# Patient Record
Sex: Male | Born: 1935 | Race: Black or African American | Hispanic: No | Marital: Married | Smoking: Former smoker
Health system: Southern US, Community
[De-identification: ages and names within clinical notes are randomized; demographics above are authoritative.]

## PROBLEM LIST (undated history)

## (undated) DIAGNOSIS — M1990 Unspecified osteoarthritis, unspecified site: Secondary | ICD-10-CM

## (undated) DIAGNOSIS — G20 Parkinson's disease: Secondary | ICD-10-CM

## (undated) DIAGNOSIS — I10 Essential (primary) hypertension: Secondary | ICD-10-CM

## (undated) DIAGNOSIS — G20A1 Parkinson's disease without dyskinesia, without mention of fluctuations: Secondary | ICD-10-CM

## (undated) DIAGNOSIS — E7800 Pure hypercholesterolemia, unspecified: Secondary | ICD-10-CM

## (undated) DIAGNOSIS — N400 Enlarged prostate without lower urinary tract symptoms: Secondary | ICD-10-CM

## (undated) DIAGNOSIS — F0390 Unspecified dementia without behavioral disturbance: Secondary | ICD-10-CM

## (undated) HISTORY — DX: Unspecified osteoarthritis, unspecified site: M19.90

## (undated) HISTORY — DX: Parkinson's disease: G20

## (undated) HISTORY — DX: Pure hypercholesterolemia, unspecified: E78.00

## (undated) HISTORY — DX: Essential (primary) hypertension: I10

## (undated) HISTORY — DX: Benign prostatic hyperplasia without lower urinary tract symptoms: N40.0

## (undated) HISTORY — PX: OTHER SURGICAL HISTORY: SHX169

## (undated) HISTORY — DX: Parkinson's disease without dyskinesia, without mention of fluctuations: G20.A1

---

## 2005-05-06 ENCOUNTER — Emergency Department (HOSPITAL_COMMUNITY): Admission: EM | Admit: 2005-05-06 | Discharge: 2005-05-06 | Payer: Self-pay | Admitting: Emergency Medicine

## 2011-08-05 DIAGNOSIS — M159 Polyosteoarthritis, unspecified: Secondary | ICD-10-CM | POA: Diagnosis not present

## 2011-08-15 DIAGNOSIS — M6281 Muscle weakness (generalized): Secondary | ICD-10-CM | POA: Diagnosis not present

## 2011-08-15 DIAGNOSIS — Z9181 History of falling: Secondary | ICD-10-CM | POA: Diagnosis not present

## 2011-08-15 DIAGNOSIS — M1990 Unspecified osteoarthritis, unspecified site: Secondary | ICD-10-CM | POA: Diagnosis not present

## 2011-08-15 DIAGNOSIS — IMO0001 Reserved for inherently not codable concepts without codable children: Secondary | ICD-10-CM | POA: Diagnosis not present

## 2011-08-15 DIAGNOSIS — I10 Essential (primary) hypertension: Secondary | ICD-10-CM | POA: Diagnosis not present

## 2011-08-15 DIAGNOSIS — G20 Parkinson's disease: Secondary | ICD-10-CM | POA: Diagnosis not present

## 2011-08-19 DIAGNOSIS — IMO0001 Reserved for inherently not codable concepts without codable children: Secondary | ICD-10-CM | POA: Diagnosis not present

## 2011-08-19 DIAGNOSIS — I10 Essential (primary) hypertension: Secondary | ICD-10-CM | POA: Diagnosis not present

## 2011-08-19 DIAGNOSIS — M1990 Unspecified osteoarthritis, unspecified site: Secondary | ICD-10-CM | POA: Diagnosis not present

## 2011-08-19 DIAGNOSIS — Z9181 History of falling: Secondary | ICD-10-CM | POA: Diagnosis not present

## 2011-08-19 DIAGNOSIS — G20 Parkinson's disease: Secondary | ICD-10-CM | POA: Diagnosis not present

## 2011-08-19 DIAGNOSIS — M6281 Muscle weakness (generalized): Secondary | ICD-10-CM | POA: Diagnosis not present

## 2011-08-21 DIAGNOSIS — I10 Essential (primary) hypertension: Secondary | ICD-10-CM | POA: Diagnosis not present

## 2011-08-21 DIAGNOSIS — M6281 Muscle weakness (generalized): Secondary | ICD-10-CM | POA: Diagnosis not present

## 2011-08-21 DIAGNOSIS — IMO0001 Reserved for inherently not codable concepts without codable children: Secondary | ICD-10-CM | POA: Diagnosis not present

## 2011-08-21 DIAGNOSIS — M1990 Unspecified osteoarthritis, unspecified site: Secondary | ICD-10-CM | POA: Diagnosis not present

## 2011-08-21 DIAGNOSIS — Z9181 History of falling: Secondary | ICD-10-CM | POA: Diagnosis not present

## 2011-08-21 DIAGNOSIS — G20 Parkinson's disease: Secondary | ICD-10-CM | POA: Diagnosis not present

## 2011-08-26 DIAGNOSIS — IMO0001 Reserved for inherently not codable concepts without codable children: Secondary | ICD-10-CM | POA: Diagnosis not present

## 2011-08-26 DIAGNOSIS — M1990 Unspecified osteoarthritis, unspecified site: Secondary | ICD-10-CM | POA: Diagnosis not present

## 2011-08-26 DIAGNOSIS — M6281 Muscle weakness (generalized): Secondary | ICD-10-CM | POA: Diagnosis not present

## 2011-08-26 DIAGNOSIS — Z9181 History of falling: Secondary | ICD-10-CM | POA: Diagnosis not present

## 2011-08-26 DIAGNOSIS — I10 Essential (primary) hypertension: Secondary | ICD-10-CM | POA: Diagnosis not present

## 2011-08-26 DIAGNOSIS — G20 Parkinson's disease: Secondary | ICD-10-CM | POA: Diagnosis not present

## 2011-08-28 DIAGNOSIS — IMO0001 Reserved for inherently not codable concepts without codable children: Secondary | ICD-10-CM | POA: Diagnosis not present

## 2011-08-28 DIAGNOSIS — M1990 Unspecified osteoarthritis, unspecified site: Secondary | ICD-10-CM | POA: Diagnosis not present

## 2011-08-28 DIAGNOSIS — M6281 Muscle weakness (generalized): Secondary | ICD-10-CM | POA: Diagnosis not present

## 2011-08-28 DIAGNOSIS — G20 Parkinson's disease: Secondary | ICD-10-CM | POA: Diagnosis not present

## 2011-08-28 DIAGNOSIS — Z9181 History of falling: Secondary | ICD-10-CM | POA: Diagnosis not present

## 2011-08-28 DIAGNOSIS — I10 Essential (primary) hypertension: Secondary | ICD-10-CM | POA: Diagnosis not present

## 2011-08-30 DIAGNOSIS — G20 Parkinson's disease: Secondary | ICD-10-CM | POA: Diagnosis not present

## 2011-08-30 DIAGNOSIS — M4800 Spinal stenosis, site unspecified: Secondary | ICD-10-CM | POA: Diagnosis not present

## 2011-08-30 DIAGNOSIS — R269 Unspecified abnormalities of gait and mobility: Secondary | ICD-10-CM | POA: Diagnosis not present

## 2011-08-30 DIAGNOSIS — R413 Other amnesia: Secondary | ICD-10-CM | POA: Diagnosis not present

## 2011-09-02 DIAGNOSIS — G20 Parkinson's disease: Secondary | ICD-10-CM | POA: Diagnosis not present

## 2011-09-02 DIAGNOSIS — Z9181 History of falling: Secondary | ICD-10-CM | POA: Diagnosis not present

## 2011-09-02 DIAGNOSIS — I10 Essential (primary) hypertension: Secondary | ICD-10-CM | POA: Diagnosis not present

## 2011-09-02 DIAGNOSIS — M1990 Unspecified osteoarthritis, unspecified site: Secondary | ICD-10-CM | POA: Diagnosis not present

## 2011-09-02 DIAGNOSIS — M6281 Muscle weakness (generalized): Secondary | ICD-10-CM | POA: Diagnosis not present

## 2011-09-02 DIAGNOSIS — IMO0001 Reserved for inherently not codable concepts without codable children: Secondary | ICD-10-CM | POA: Diagnosis not present

## 2011-09-04 DIAGNOSIS — M6281 Muscle weakness (generalized): Secondary | ICD-10-CM | POA: Diagnosis not present

## 2011-09-04 DIAGNOSIS — I10 Essential (primary) hypertension: Secondary | ICD-10-CM | POA: Diagnosis not present

## 2011-09-04 DIAGNOSIS — G20 Parkinson's disease: Secondary | ICD-10-CM | POA: Diagnosis not present

## 2011-09-04 DIAGNOSIS — IMO0001 Reserved for inherently not codable concepts without codable children: Secondary | ICD-10-CM | POA: Diagnosis not present

## 2011-09-04 DIAGNOSIS — M1990 Unspecified osteoarthritis, unspecified site: Secondary | ICD-10-CM | POA: Diagnosis not present

## 2011-09-04 DIAGNOSIS — Z9181 History of falling: Secondary | ICD-10-CM | POA: Diagnosis not present

## 2011-09-09 DIAGNOSIS — G20 Parkinson's disease: Secondary | ICD-10-CM | POA: Diagnosis not present

## 2011-09-09 DIAGNOSIS — M1990 Unspecified osteoarthritis, unspecified site: Secondary | ICD-10-CM | POA: Diagnosis not present

## 2011-09-09 DIAGNOSIS — IMO0001 Reserved for inherently not codable concepts without codable children: Secondary | ICD-10-CM | POA: Diagnosis not present

## 2011-09-09 DIAGNOSIS — Z9181 History of falling: Secondary | ICD-10-CM | POA: Diagnosis not present

## 2011-09-09 DIAGNOSIS — M6281 Muscle weakness (generalized): Secondary | ICD-10-CM | POA: Diagnosis not present

## 2011-09-09 DIAGNOSIS — I10 Essential (primary) hypertension: Secondary | ICD-10-CM | POA: Diagnosis not present

## 2011-09-11 DIAGNOSIS — M6281 Muscle weakness (generalized): Secondary | ICD-10-CM | POA: Diagnosis not present

## 2011-09-11 DIAGNOSIS — IMO0001 Reserved for inherently not codable concepts without codable children: Secondary | ICD-10-CM | POA: Diagnosis not present

## 2011-09-11 DIAGNOSIS — I10 Essential (primary) hypertension: Secondary | ICD-10-CM | POA: Diagnosis not present

## 2011-09-11 DIAGNOSIS — Z9181 History of falling: Secondary | ICD-10-CM | POA: Diagnosis not present

## 2011-09-11 DIAGNOSIS — M1990 Unspecified osteoarthritis, unspecified site: Secondary | ICD-10-CM | POA: Diagnosis not present

## 2011-09-11 DIAGNOSIS — G20 Parkinson's disease: Secondary | ICD-10-CM | POA: Diagnosis not present

## 2011-09-16 DIAGNOSIS — I10 Essential (primary) hypertension: Secondary | ICD-10-CM | POA: Diagnosis not present

## 2011-09-16 DIAGNOSIS — I2510 Atherosclerotic heart disease of native coronary artery without angina pectoris: Secondary | ICD-10-CM | POA: Diagnosis not present

## 2011-09-16 DIAGNOSIS — N400 Enlarged prostate without lower urinary tract symptoms: Secondary | ICD-10-CM | POA: Diagnosis not present

## 2011-09-16 DIAGNOSIS — IMO0001 Reserved for inherently not codable concepts without codable children: Secondary | ICD-10-CM | POA: Diagnosis not present

## 2011-09-16 DIAGNOSIS — I252 Old myocardial infarction: Secondary | ICD-10-CM | POA: Diagnosis not present

## 2011-09-16 DIAGNOSIS — Z9181 History of falling: Secondary | ICD-10-CM | POA: Diagnosis not present

## 2011-09-16 DIAGNOSIS — M1990 Unspecified osteoarthritis, unspecified site: Secondary | ICD-10-CM | POA: Diagnosis not present

## 2011-09-16 DIAGNOSIS — M6281 Muscle weakness (generalized): Secondary | ICD-10-CM | POA: Diagnosis not present

## 2011-09-16 DIAGNOSIS — G20 Parkinson's disease: Secondary | ICD-10-CM | POA: Diagnosis not present

## 2011-09-18 DIAGNOSIS — M6281 Muscle weakness (generalized): Secondary | ICD-10-CM | POA: Diagnosis not present

## 2011-09-18 DIAGNOSIS — IMO0001 Reserved for inherently not codable concepts without codable children: Secondary | ICD-10-CM | POA: Diagnosis not present

## 2011-09-18 DIAGNOSIS — I10 Essential (primary) hypertension: Secondary | ICD-10-CM | POA: Diagnosis not present

## 2011-09-18 DIAGNOSIS — G20 Parkinson's disease: Secondary | ICD-10-CM | POA: Diagnosis not present

## 2011-09-18 DIAGNOSIS — Z9181 History of falling: Secondary | ICD-10-CM | POA: Diagnosis not present

## 2011-09-18 DIAGNOSIS — M1990 Unspecified osteoarthritis, unspecified site: Secondary | ICD-10-CM | POA: Diagnosis not present

## 2011-09-23 DIAGNOSIS — IMO0001 Reserved for inherently not codable concepts without codable children: Secondary | ICD-10-CM | POA: Diagnosis not present

## 2011-09-23 DIAGNOSIS — M1990 Unspecified osteoarthritis, unspecified site: Secondary | ICD-10-CM | POA: Diagnosis not present

## 2011-09-23 DIAGNOSIS — G20 Parkinson's disease: Secondary | ICD-10-CM | POA: Diagnosis not present

## 2011-09-23 DIAGNOSIS — I10 Essential (primary) hypertension: Secondary | ICD-10-CM | POA: Diagnosis not present

## 2011-09-23 DIAGNOSIS — M6281 Muscle weakness (generalized): Secondary | ICD-10-CM | POA: Diagnosis not present

## 2011-09-23 DIAGNOSIS — Z9181 History of falling: Secondary | ICD-10-CM | POA: Diagnosis not present

## 2011-09-25 DIAGNOSIS — IMO0001 Reserved for inherently not codable concepts without codable children: Secondary | ICD-10-CM | POA: Diagnosis not present

## 2011-09-25 DIAGNOSIS — G20 Parkinson's disease: Secondary | ICD-10-CM | POA: Diagnosis not present

## 2011-09-25 DIAGNOSIS — I10 Essential (primary) hypertension: Secondary | ICD-10-CM | POA: Diagnosis not present

## 2011-09-25 DIAGNOSIS — M6281 Muscle weakness (generalized): Secondary | ICD-10-CM | POA: Diagnosis not present

## 2011-09-25 DIAGNOSIS — Z9181 History of falling: Secondary | ICD-10-CM | POA: Diagnosis not present

## 2011-09-25 DIAGNOSIS — M1990 Unspecified osteoarthritis, unspecified site: Secondary | ICD-10-CM | POA: Diagnosis not present

## 2011-09-30 DIAGNOSIS — M1990 Unspecified osteoarthritis, unspecified site: Secondary | ICD-10-CM | POA: Diagnosis not present

## 2011-09-30 DIAGNOSIS — G20 Parkinson's disease: Secondary | ICD-10-CM | POA: Diagnosis not present

## 2011-09-30 DIAGNOSIS — I10 Essential (primary) hypertension: Secondary | ICD-10-CM | POA: Diagnosis not present

## 2011-09-30 DIAGNOSIS — IMO0001 Reserved for inherently not codable concepts without codable children: Secondary | ICD-10-CM | POA: Diagnosis not present

## 2011-09-30 DIAGNOSIS — M6281 Muscle weakness (generalized): Secondary | ICD-10-CM | POA: Diagnosis not present

## 2011-09-30 DIAGNOSIS — Z9181 History of falling: Secondary | ICD-10-CM | POA: Diagnosis not present

## 2011-10-02 DIAGNOSIS — IMO0001 Reserved for inherently not codable concepts without codable children: Secondary | ICD-10-CM | POA: Diagnosis not present

## 2011-10-02 DIAGNOSIS — M6281 Muscle weakness (generalized): Secondary | ICD-10-CM | POA: Diagnosis not present

## 2011-10-02 DIAGNOSIS — G20 Parkinson's disease: Secondary | ICD-10-CM | POA: Diagnosis not present

## 2011-10-02 DIAGNOSIS — M1990 Unspecified osteoarthritis, unspecified site: Secondary | ICD-10-CM | POA: Diagnosis not present

## 2011-10-02 DIAGNOSIS — Z9181 History of falling: Secondary | ICD-10-CM | POA: Diagnosis not present

## 2011-10-02 DIAGNOSIS — I10 Essential (primary) hypertension: Secondary | ICD-10-CM | POA: Diagnosis not present

## 2011-10-07 DIAGNOSIS — G20 Parkinson's disease: Secondary | ICD-10-CM | POA: Diagnosis not present

## 2011-10-07 DIAGNOSIS — M6281 Muscle weakness (generalized): Secondary | ICD-10-CM | POA: Diagnosis not present

## 2011-10-07 DIAGNOSIS — Z9181 History of falling: Secondary | ICD-10-CM | POA: Diagnosis not present

## 2011-10-07 DIAGNOSIS — I10 Essential (primary) hypertension: Secondary | ICD-10-CM | POA: Diagnosis not present

## 2011-10-07 DIAGNOSIS — M1990 Unspecified osteoarthritis, unspecified site: Secondary | ICD-10-CM | POA: Diagnosis not present

## 2011-10-07 DIAGNOSIS — IMO0001 Reserved for inherently not codable concepts without codable children: Secondary | ICD-10-CM | POA: Diagnosis not present

## 2011-10-09 DIAGNOSIS — M1990 Unspecified osteoarthritis, unspecified site: Secondary | ICD-10-CM | POA: Diagnosis not present

## 2011-10-09 DIAGNOSIS — M6281 Muscle weakness (generalized): Secondary | ICD-10-CM | POA: Diagnosis not present

## 2011-10-09 DIAGNOSIS — Z9181 History of falling: Secondary | ICD-10-CM | POA: Diagnosis not present

## 2011-10-09 DIAGNOSIS — I10 Essential (primary) hypertension: Secondary | ICD-10-CM | POA: Diagnosis not present

## 2011-10-09 DIAGNOSIS — IMO0001 Reserved for inherently not codable concepts without codable children: Secondary | ICD-10-CM | POA: Diagnosis not present

## 2011-10-09 DIAGNOSIS — G20 Parkinson's disease: Secondary | ICD-10-CM | POA: Diagnosis not present

## 2011-11-01 DIAGNOSIS — F068 Other specified mental disorders due to known physiological condition: Secondary | ICD-10-CM | POA: Diagnosis not present

## 2011-11-01 DIAGNOSIS — G20 Parkinson's disease: Secondary | ICD-10-CM | POA: Diagnosis not present

## 2011-11-01 DIAGNOSIS — R269 Unspecified abnormalities of gait and mobility: Secondary | ICD-10-CM | POA: Diagnosis not present

## 2011-11-01 DIAGNOSIS — R443 Hallucinations, unspecified: Secondary | ICD-10-CM | POA: Diagnosis not present

## 2011-11-04 DIAGNOSIS — N400 Enlarged prostate without lower urinary tract symptoms: Secondary | ICD-10-CM | POA: Diagnosis not present

## 2011-11-04 DIAGNOSIS — G20 Parkinson's disease: Secondary | ICD-10-CM | POA: Diagnosis not present

## 2011-11-04 DIAGNOSIS — M159 Polyosteoarthritis, unspecified: Secondary | ICD-10-CM | POA: Diagnosis not present

## 2011-11-04 DIAGNOSIS — I10 Essential (primary) hypertension: Secondary | ICD-10-CM | POA: Diagnosis not present

## 2012-02-12 DIAGNOSIS — I10 Essential (primary) hypertension: Secondary | ICD-10-CM | POA: Diagnosis not present

## 2012-02-12 DIAGNOSIS — M159 Polyosteoarthritis, unspecified: Secondary | ICD-10-CM | POA: Diagnosis not present

## 2012-02-12 DIAGNOSIS — N400 Enlarged prostate without lower urinary tract symptoms: Secondary | ICD-10-CM | POA: Diagnosis not present

## 2012-02-12 DIAGNOSIS — G20 Parkinson's disease: Secondary | ICD-10-CM | POA: Diagnosis not present

## 2012-03-12 DIAGNOSIS — F068 Other specified mental disorders due to known physiological condition: Secondary | ICD-10-CM | POA: Diagnosis not present

## 2012-03-12 DIAGNOSIS — R269 Unspecified abnormalities of gait and mobility: Secondary | ICD-10-CM | POA: Diagnosis not present

## 2012-03-12 DIAGNOSIS — G20 Parkinson's disease: Secondary | ICD-10-CM | POA: Diagnosis not present

## 2012-03-12 DIAGNOSIS — R443 Hallucinations, unspecified: Secondary | ICD-10-CM | POA: Diagnosis not present

## 2012-03-25 DIAGNOSIS — M2020 Hallux rigidus, unspecified foot: Secondary | ICD-10-CM | POA: Diagnosis not present

## 2012-03-25 DIAGNOSIS — L600 Ingrowing nail: Secondary | ICD-10-CM | POA: Diagnosis not present

## 2012-03-25 DIAGNOSIS — B351 Tinea unguium: Secondary | ICD-10-CM | POA: Diagnosis not present

## 2012-07-06 DIAGNOSIS — M79609 Pain in unspecified limb: Secondary | ICD-10-CM | POA: Diagnosis not present

## 2012-07-06 DIAGNOSIS — M2040 Other hammer toe(s) (acquired), unspecified foot: Secondary | ICD-10-CM | POA: Diagnosis not present

## 2012-07-06 DIAGNOSIS — B351 Tinea unguium: Secondary | ICD-10-CM | POA: Diagnosis not present

## 2012-07-06 DIAGNOSIS — D492 Neoplasm of unspecified behavior of bone, soft tissue, and skin: Secondary | ICD-10-CM | POA: Diagnosis not present

## 2012-07-23 DIAGNOSIS — B351 Tinea unguium: Secondary | ICD-10-CM | POA: Diagnosis not present

## 2012-07-23 DIAGNOSIS — Z5189 Encounter for other specified aftercare: Secondary | ICD-10-CM | POA: Diagnosis not present

## 2012-07-24 DIAGNOSIS — R269 Unspecified abnormalities of gait and mobility: Secondary | ICD-10-CM | POA: Diagnosis not present

## 2012-07-24 DIAGNOSIS — G20 Parkinson's disease: Secondary | ICD-10-CM | POA: Diagnosis not present

## 2012-07-24 DIAGNOSIS — F068 Other specified mental disorders due to known physiological condition: Secondary | ICD-10-CM | POA: Diagnosis not present

## 2012-07-24 DIAGNOSIS — R443 Hallucinations, unspecified: Secondary | ICD-10-CM | POA: Diagnosis not present

## 2012-08-11 DIAGNOSIS — M159 Polyosteoarthritis, unspecified: Secondary | ICD-10-CM | POA: Diagnosis not present

## 2012-09-09 DIAGNOSIS — Z5189 Encounter for other specified aftercare: Secondary | ICD-10-CM | POA: Diagnosis not present

## 2012-09-09 DIAGNOSIS — B351 Tinea unguium: Secondary | ICD-10-CM | POA: Diagnosis not present

## 2012-09-15 DIAGNOSIS — R972 Elevated prostate specific antigen [PSA]: Secondary | ICD-10-CM | POA: Diagnosis not present

## 2012-09-15 DIAGNOSIS — N403 Nodular prostate with lower urinary tract symptoms: Secondary | ICD-10-CM | POA: Diagnosis not present

## 2012-09-15 DIAGNOSIS — N138 Other obstructive and reflux uropathy: Secondary | ICD-10-CM | POA: Diagnosis not present

## 2012-09-15 DIAGNOSIS — R339 Retention of urine, unspecified: Secondary | ICD-10-CM | POA: Diagnosis not present

## 2012-09-23 DIAGNOSIS — H4010X0 Unspecified open-angle glaucoma, stage unspecified: Secondary | ICD-10-CM | POA: Diagnosis not present

## 2012-10-06 DIAGNOSIS — B351 Tinea unguium: Secondary | ICD-10-CM | POA: Diagnosis not present

## 2012-10-06 DIAGNOSIS — Z5189 Encounter for other specified aftercare: Secondary | ICD-10-CM | POA: Diagnosis not present

## 2012-10-08 DIAGNOSIS — H4010X0 Unspecified open-angle glaucoma, stage unspecified: Secondary | ICD-10-CM | POA: Diagnosis not present

## 2013-02-02 DIAGNOSIS — Z23 Encounter for immunization: Secondary | ICD-10-CM | POA: Diagnosis not present

## 2013-02-02 DIAGNOSIS — G20 Parkinson's disease: Secondary | ICD-10-CM | POA: Diagnosis not present

## 2013-02-02 DIAGNOSIS — I10 Essential (primary) hypertension: Secondary | ICD-10-CM | POA: Diagnosis not present

## 2013-02-02 DIAGNOSIS — N400 Enlarged prostate without lower urinary tract symptoms: Secondary | ICD-10-CM | POA: Diagnosis not present

## 2013-02-02 DIAGNOSIS — Z1331 Encounter for screening for depression: Secondary | ICD-10-CM | POA: Diagnosis not present

## 2013-02-02 DIAGNOSIS — F0390 Unspecified dementia without behavioral disturbance: Secondary | ICD-10-CM | POA: Diagnosis not present

## 2013-02-04 DIAGNOSIS — N400 Enlarged prostate without lower urinary tract symptoms: Secondary | ICD-10-CM | POA: Diagnosis not present

## 2013-02-04 DIAGNOSIS — R5381 Other malaise: Secondary | ICD-10-CM | POA: Diagnosis not present

## 2013-02-04 DIAGNOSIS — F0390 Unspecified dementia without behavioral disturbance: Secondary | ICD-10-CM | POA: Diagnosis not present

## 2013-02-04 DIAGNOSIS — G20 Parkinson's disease: Secondary | ICD-10-CM | POA: Diagnosis not present

## 2013-02-04 DIAGNOSIS — W19XXXA Unspecified fall, initial encounter: Secondary | ICD-10-CM | POA: Diagnosis not present

## 2013-02-04 DIAGNOSIS — H9190 Unspecified hearing loss, unspecified ear: Secondary | ICD-10-CM | POA: Diagnosis not present

## 2013-02-04 DIAGNOSIS — I10 Essential (primary) hypertension: Secondary | ICD-10-CM | POA: Diagnosis not present

## 2013-03-02 ENCOUNTER — Encounter: Payer: Self-pay | Admitting: Nurse Practitioner

## 2013-03-03 ENCOUNTER — Encounter: Payer: Self-pay | Admitting: Neurology

## 2013-03-03 ENCOUNTER — Ambulatory Visit (INDEPENDENT_AMBULATORY_CARE_PROVIDER_SITE_OTHER): Payer: Medicare Other | Admitting: Neurology

## 2013-03-03 VITALS — BP 140/80 | HR 84 | Ht 72.0 in | Wt 168.0 lb

## 2013-03-03 DIAGNOSIS — F0280 Dementia in other diseases classified elsewhere without behavioral disturbance: Secondary | ICD-10-CM | POA: Diagnosis not present

## 2013-03-03 DIAGNOSIS — G20 Parkinson's disease: Secondary | ICD-10-CM | POA: Insufficient documentation

## 2013-03-03 DIAGNOSIS — K117 Disturbances of salivary secretion: Secondary | ICD-10-CM

## 2013-03-03 MED ORDER — CARBIDOPA-LEVODOPA 25-100 MG PO TABS: ORAL_TABLET | ORAL | Status: DC

## 2013-03-03 MED ORDER — RIVASTIGMINE 9.5 MG/24HR TD PT24: 1.0000 | MEDICATED_PATCH | Freq: Every day | TRANSDERMAL | Status: DC

## 2013-03-03 NOTE — Progress Notes (Signed)
[name]  Provider:  Dr Hosie Cooke Referring Provider: Sissy Hoff, MD Primary Care Physician:  Chad Hoff, MD  CC:  PD  HPI:  Chad Cooke is a 79 y.o. male here as a referral from Chad Cooke for transfer of care in PD patient new to the area. Came from [place].   Diagnosed with Parkinson's around 10 years ago. Initial symptoms were shuffling of gait in slow quiet speech. Per his daughter he is a slowly progressing since then. Main concern today is walking, feels full unsteady on his feet. No recent falls, though has had falls in the past. Walking fluctuates throughout the day. Currently taking Sinemet CR 50/203 times a day. Also on Aricept 10 mg daily. He is taking the Sinemet at 8:30 AM, 12:30 and 9 PM. Notes motor fluctuations throughout the day. Notes that Aricept makes him feel very groggy. Notes stiffness and cramping of his muscles, rest tremor worse in left hand. No difficulty with swallowing, no choking. Does note some excessive saliva production but not bothersome at this time. 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  Review of Systems: Out of a complete 14 system review, the patient complains of only the following symptoms, and all other reviewed systems are negative. Positive for joint pain memory loss confusion  History   Social History  . Marital Status: Married    Spouse Name: N/A    Number of Children: N/A  . Years of Education: N/A   Occupational History  . Retired    Social History Main Topics  . Smoking status: Never Smoker   . Smokeless tobacco: Never Used  . Alcohol Use: No  . Drug Use: No  . Sexual Activity: Not on file   Other Topics Concern  . Not on file   Social History Narrative   Patient is married.    Patient is retired.           Family History  Problem Relation Age of Onset  . Hypertension Mother   . Alzheimer's disease Maternal Grandmother   . Prostate cancer Brother      Past Medical History  Diagnosis Date  . Hypertension   . Hypercholesteremia   . Parkinson disease   . Osteoarthritis     History reviewed. No pertinent past surgical history.  Current Outpatient Prescriptions  Medication Sig Dispense Refill  . aspirin 81 MG tablet Take 81 mg by mouth daily.      . carbidopa-levodopa (SINEMET CR) 50-200 MG per tablet Take 1 tablet by mouth 2 (two) times daily.      Marland Kitchen donepezil (ARICEPT) 10 MG tablet Take 10 mg by mouth at bedtime as needed.      . finasteride (PROSCAR) 5 MG tablet Take 5 mg by mouth daily.      . QUEtiapine (SEROQUEL) 25 MG tablet Take 25 mg by mouth 2 (two) times daily.      . tamsulosin (FLOMAX) 0.4 MG CAPS capsule Take 0.4 mg by mouth daily after supper.       No current facility-administered medications for this visit.    Allergies as of 03/03/2013  . (No Known Allergies)    Vitals: BP 140/80  Pulse 84  Ht 6' (1.829 m)  Wt 168 lb (76.204 kg)  BMI 22.78 kg/m2 Last Weight:  Wt Readings from Last 1 Encounters:  03/03/13 168 lb (76.204 kg)   Last Height:   Ht Readings from Last 1 Encounters:  03/03/13  6' (1.829 m)     Physical exam: Exam: Gen: NAD, conversant Eyes: anicteric sclerae, moist conjunctivae HENT: Atraumati Neck: Trachea midline; supple,  Lungs: CTA, no wheezing, rales, rhonic                          CV: RRR, no MRG Abdomen: Soft, non-tender;  Extremities: No peripheral edema  Skin: Normal temperature, no rash,  Psych: Appropriate affect, pleasant  Neuro: MS: AA&Ox3, appropriately interactive, normal affect   Speech: fluent w/o paraphasic error  Memory: good recent and remote recall  CN: PERRL, EOMI no nystagmus, no ptosis, sensation intact to LT V1-V3 bilat, face symmetric, no weakness, hearing grossly intact, palate elevates symmetrically, shoulder shrug 5/5 bilat,  tongue protrudes midline, no fasiculations noted.  Motor: normal bulk and tone Strength: 5/5  In all  extremities  Reflexes: symmetrical, bilat downgoing toes  Sens: LT intact in all extremities  Brief Motor UPDRS  Speech: hypophonic/dysarthria  Facial Expression:masked facies  Tremor: Rest R1 L1 Action/postural R0 L1 Rigidity: RUE 1 LUE 2 Finger taps:   R:2 L:3  Open/close hands: R:2 L:2  Foot taps: R:1 L:2  Arising from Chair: Requires assistance  Gait/FOG: Slow, stooped posture, shuffling gait, requires assistance to stand   Assessment:  After physical and neurologic examination, review of laboratory studies, imaging, neurophysiology testing and pre-existing records, assessment will be reviewed on the problem list.  Plan:  Treatment plan and additional workup will be reviewed under Problem List.  Mr Chad Cooke is a pleasant 79y/o woman with Parkinson's disease presenting for establishment of care as he recently moved to the area. Overall is doing well, notes fluctuations throughout the day, difficulty with walking. Was taking Sinemet CR 50-200 3 times a day and Aricept 10 mg daily. Notes that her stomach some excessively groggy. Physical exam is pertinent for generalized bradykinesia, worse on the left, mild rest tremor, slow stooped unsteady gait. Will switch patient's Sinemet regimen and change Aricept to Exelon patch.  1)PD 2)dementia-likely PDD 3)RBD 4)Sialorrhea  -switch Sinemet to 25-100 IR 1.5 tablets QID (9-1-5-9) -switch Aricept to Exelon, titrated up to 9.5mg . Will plan to increase to 13.3mg  as tolerated in the future -consider addition of Azilect in the future -continue PT -consider melatonin for RBD in the future if worsens -follow up in 3 to 4 months

## 2013-03-03 NOTE — Patient Instructions (Signed)
Overall you are doing fairly well but I do want to suggest a few things today:   Remember to drink plenty of fluid, eat healthy meals and do not skip any meals. Try to keep a regular sleep-wake schedule and try to exercise daily, particularly in the form of walking, 20-30 minutes a day, if you can.   As far as your medications are concerned, I would like to suggest a few changes: 1)Discontinue the Sinemet 50-200 CR. Instead take Sinemet 25-100 IR using the following schedule -1.5 tablets at 830am, 1230, 430 and bedtime -take this medication before or after eating  2)Discontinue the Aricept and instead start using an Exelon patch with the following schedule: -use 4.6mg  patch for 1 week then increase to the 9.5mg  patch  I would like to see you back in 3 to 4 months, sooner if we need to. Please call us with any interim questions, concerns, problems, updates or refill requests.   Please also call us for any test results so we can go over those with you on the phone.  My clinical assistant and will answer any of your questions and relay your messages to me and also relay most of my messages to you.   Our phone number is [phone]. We also have an after hours call service for urgent matters and there is a physician on-call for urgent questions. For any emergencies you know to call 911 or go to the nearest emergency room

## 2013-04-22 ENCOUNTER — Other Ambulatory Visit: Payer: Self-pay

## 2013-05-12 ENCOUNTER — Other Ambulatory Visit: Payer: Self-pay | Admitting: Neurology

## 2013-05-17 ENCOUNTER — Telehealth: Payer: Self-pay | Admitting: Neurology

## 2013-05-17 MED ORDER — QUETIAPINE FUMARATE 25 MG PO TABS: 25.0000 mg | ORAL_TABLET | Freq: Two times a day (BID) | ORAL | Status: DC

## 2013-05-17 NOTE — Telephone Encounter (Signed)
That's fine. Thanks!

## 2013-05-17 NOTE — Telephone Encounter (Signed)
I called [facility] to verify the patients dose.  They said he takes 25mg  one twice daily.  Rx has been sent.

## 2013-05-17 NOTE — Telephone Encounter (Signed)
It does not appear we have prescribed Seroquel before.  Dr Hosie Poisson, okay to fill Rx?  Please advise.  Thank you.

## 2013-07-05 ENCOUNTER — Ambulatory Visit: Payer: Medicare Other | Admitting: Neurology

## 2013-07-06 ENCOUNTER — Encounter: Payer: Self-pay | Admitting: Neurology

## 2013-07-06 ENCOUNTER — Ambulatory Visit (INDEPENDENT_AMBULATORY_CARE_PROVIDER_SITE_OTHER): Payer: Medicare Other | Admitting: Neurology

## 2013-07-06 VITALS — BP 154/93 | HR 79 | Ht 72.5 in | Wt 161.0 lb

## 2013-07-06 DIAGNOSIS — G20 Parkinson's disease: Secondary | ICD-10-CM

## 2013-07-06 NOTE — Patient Instructions (Signed)
Overall you are doing fairly well but I do want to suggest a few things today:   Remember to drink plenty of fluid, eat healthy meals and do not skip any meals. Try to eat protein with a every meal and eat a healthy snack such as fruit or nuts in between meals. Try to keep a regular sleep-wake schedule and try to exercise daily, particularly in the form of walking, 20-30 minutes a day, if you can.   As far as your medications are concerned, I would like to suggest the following: 1)Continue your medications at their current schedule and dosage 2)If you wake up overnight consider taking an additional 1 tablet of the carbidopa-levodopa  I will refer you for home physical therapy. You may benefit from using a walker to help prevent falls. One good option for Parkinson's patient is a "U-step" walker  I would like to see you back in 4 months, sooner if we need to. Please call us with any interim questions, concerns, problems, updates or refill requests.   My clinical assistant and will answer any of your questions and relay your messages to me and also relay most of my messages to you.   Our phone number is [phone]. We also have an after hours call service for urgent matters and there is a physician on-call for urgent questions. For any emergencies you know to call 911 or go to the nearest emergency room

## 2013-07-06 NOTE — Progress Notes (Signed)
[name]  Provider:  Dr Janann Colonel Referring Provider: [name], MD Primary Care Physician:  [name], MD  CC:  PD  HPI:  Chad Cooke is a 80 y.o. male here as a follow from Dr. Moreen Fowler for transfer of care in PD. Last visit was 02/2013 at which time her Sinemet was changed to IR 4x a day and she was changed to Exelon 9.5mg . Since last visit he thinks he has been doing well. Daughter notes concerns that he is having trouble with walking, takes very small steps, has had a few falls due to not using his cane. Upon arriving here did some brief PT with home PT. He continues to take Sinemet 25-100 QID, every 4 hours. Patient does not notice much benefit from the Sinemet, daughter notes decrease in tremor. Family does not feel the walking difficulties are tied to medication wearing off.   He will typically wake up frequently overnight to urinate. Wakes up in the morning feeling very stiff.    Initial visit 02/2013: Diagnosed with Parkinson's around 10 years ago. Initial symptoms were shuffling of gait in slow quiet speech. Per his daughter he is a slowly progressing since then. Main concern today is walking, feels full unsteady on his feet. No recent falls, though has had falls in the past. Walking fluctuates throughout the day. Currently taking Sinemet CR 50/203 times a day. Also on Aricept 10 mg daily. He is taking the Sinemet at 8:30 AM, 12:30 and 9 PM. Notes motor fluctuations throughout the day. Notes that Aricept makes him feel very groggy. Notes stiffness and cramping of his muscles, rest tremor worse in left hand. No difficulty with swallowing, no choking. Does note some excessive saliva production but not bothersome at this time. 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  Review of Systems: Out of a complete 14 system review, the patient complains of only the following symptoms, and all other reviewed systems are  negative. Positive for frequency of urination walking difficulties  History   Social History  . Marital Status: Married    Spouse Name: Barbaraann Share    Number of Children: 1  . Years of Education: Bachelor   Occupational History  . Retired    Social History Main Topics  . Smoking status: Never Smoker   . Smokeless tobacco: Never Used  . Alcohol Use: No  . Drug Use: No  . Sexual Activity: Not on file   Other Topics Concern  . Not on file   Social History Narrative   Patient is married, Barbaraann Share), has 1child   Patient is retired.    Patient is right handed   Education level is Bachelor's degree    Caffeine consumption is 0          Family History  Problem Relation Age of Onset  . Hypertension Mother   . Alzheimer's disease Maternal Grandmother   . Prostate cancer Brother     Past Medical History  Diagnosis Date  . Hypertension   . Hypercholesteremia   . Parkinson disease   . Osteoarthritis   . Enlarged prostate     Past Surgical History  Procedure Laterality Date  . None      Current Outpatient Prescriptions  Medication Sig Dispense Refill  . aspirin 81 MG tablet Take 81 mg by mouth daily.      . carbidopa-levodopa (SINEMET IR) 25-100 MG per tablet 1.5 tablets 4 times a day  180 tablet  6  . finasteride (PROSCAR) 5 MG tablet Take 5 mg by mouth daily.      . QUEtiapine (SEROQUEL) 25 MG tablet Take 1 tablet (25 mg total) by mouth 2 (two) times daily.  60 tablet  4  . rivastigmine (EXELON) 9.5 mg/24hr Place 1 patch (9.5 mg total) onto the skin daily.  30 patch  12  . tamsulosin (FLOMAX) 0.4 MG CAPS capsule Take 0.4 mg by mouth daily after supper.       No current facility-administered medications for this visit.    Allergies as of 07/06/2013  . (No Known Allergies)    Vitals: BP 154/93  Pulse 79  Ht 6' 0.5" (1.842 m)  Wt 161 lb (73.029 kg)  BMI 21.52 kg/m2 Last Weight:  Wt Readings from Last 1 Encounters:  07/06/13 161 lb (73.029 kg)   Last Height:    Ht Readings from Last 1 Encounters:  07/06/13 6' 0.5" (1.842 m)     Physical exam: Exam: Gen: NAD, conversant Eyes: anicteric sclerae, moist conjunctivae HENT: Atraumati Neck: Trachea midline; supple,  Lungs: CTA, no wheezing, rales, rhonic                          CV: RRR, no MRG Abdomen: Soft, non-tender;  Extremities: No peripheral edema  Skin: Normal temperature, no rash,  Psych: Appropriate affect, pleasant  Neuro: MS: AA&Ox3, appropriately interactive, normal affect   Speech: fluent w/o paraphasic error  Memory: good recent and remote recall  CN: PERRL, EOMI no nystagmus, no ptosis, sensation intact to LT V1-V3 bilat, face symmetric, no weakness, hearing grossly intact, palate elevates symmetrically, shoulder shrug 5/5 bilat,  tongue protrudes midline, no fasiculations noted.  Motor: normal bulk and tone Strength: 5/5  In all extremities  Reflexes: symmetrical, bilat downgoing toes  Sens: LT intact in all extremities  Brief Motor UPDRS  Speech: hypophonic/dysarthria  Facial Expression:masked facies  Tremor: Rest R1 L1 Action/postural R0 L1 Rigidity: RUE 1 LUE 2 Finger taps:   R:2 L:3  Open/close hands: R:2 L:2  Foot taps: R:1 L:2  Arising from Chair: Requires assistance  Gait/FOG: Slow, stooped posture, shuffling gait, requires assistance to stand   Assessment:  After physical and neurologic examination, review of laboratory studies, imaging, neurophysiology testing and pre-existing records, assessment will be reviewed on the problem list.  Plan:  Treatment plan and additional workup will be reviewed under Problem List.  1)PD 2)dementia-likely PDD 3)RBD 4)Sialorrhea  Chad Cooke is a pleasant 80y/o woman with Parkinson's disease presenting for follow up visit. At last visit his Sinemet was changed to IR 25-100 QID and he was changed from Azilect to Exelon patch. Main concern today is difficulty walking, FOG and instability. He  also notes frequent nocturnal arousals and morning stiffness/bradykinesia. Will refer patient to PT for gait training and evaluation for possible walker to prevent falls. Added overnight tablet of Sinemet 25-100 to be taken between 1-2 am if needed.  Overall is doing well, notes fluctuations throughout the day, difficulty with walking. Continue Exelon 9.5mg  daily, can consider increasing to 13.3mg  in the future.

## 2013-07-12 ENCOUNTER — Telehealth: Payer: Self-pay | Admitting: Neurology

## 2013-07-12 ENCOUNTER — Emergency Department (HOSPITAL_COMMUNITY): Payer: Medicare Other

## 2013-07-12 ENCOUNTER — Encounter (HOSPITAL_COMMUNITY): Payer: Self-pay | Admitting: Emergency Medicine

## 2013-07-12 ENCOUNTER — Emergency Department (HOSPITAL_COMMUNITY)
Admission: EM | Admit: 2013-07-12 | Discharge: 2013-07-12 | Disposition: A | Discharge status: Home / Self Care | Payer: Medicare Other | Attending: Emergency Medicine | Admitting: Emergency Medicine

## 2013-07-12 DIAGNOSIS — M1990 Unspecified osteoarthritis, unspecified site: Secondary | ICD-10-CM | POA: Diagnosis not present

## 2013-07-12 DIAGNOSIS — R4182 Altered mental status, unspecified: Secondary | ICD-10-CM | POA: Diagnosis not present

## 2013-07-12 DIAGNOSIS — Z79899 Other long term (current) drug therapy: Secondary | ICD-10-CM | POA: Insufficient documentation

## 2013-07-12 DIAGNOSIS — Z87891 Personal history of nicotine dependence: Secondary | ICD-10-CM | POA: Insufficient documentation

## 2013-07-12 DIAGNOSIS — F0281 Dementia in other diseases classified elsewhere with behavioral disturbance: Secondary | ICD-10-CM | POA: Diagnosis not present

## 2013-07-12 DIAGNOSIS — J9819 Other pulmonary collapse: Secondary | ICD-10-CM | POA: Diagnosis not present

## 2013-07-12 DIAGNOSIS — Z8639 Personal history of other endocrine, nutritional and metabolic disease: Secondary | ICD-10-CM | POA: Insufficient documentation

## 2013-07-12 DIAGNOSIS — I10 Essential (primary) hypertension: Secondary | ICD-10-CM | POA: Diagnosis not present

## 2013-07-12 DIAGNOSIS — G20 Parkinson's disease: Secondary | ICD-10-CM | POA: Diagnosis not present

## 2013-07-12 DIAGNOSIS — F02818 Dementia in other diseases classified elsewhere, unspecified severity, with other behavioral disturbance: Secondary | ICD-10-CM | POA: Diagnosis not present

## 2013-07-12 DIAGNOSIS — G20A1 Parkinson's disease without dyskinesia, without mention of fluctuations: Secondary | ICD-10-CM | POA: Insufficient documentation

## 2013-07-12 DIAGNOSIS — Z7982 Long term (current) use of aspirin: Secondary | ICD-10-CM | POA: Insufficient documentation

## 2013-07-12 DIAGNOSIS — N400 Enlarged prostate without lower urinary tract symptoms: Secondary | ICD-10-CM | POA: Insufficient documentation

## 2013-07-12 DIAGNOSIS — E860 Dehydration: Secondary | ICD-10-CM | POA: Diagnosis not present

## 2013-07-12 DIAGNOSIS — Z862 Personal history of diseases of the blood and blood-forming organs and certain disorders involving the immune mechanism: Secondary | ICD-10-CM | POA: Diagnosis not present

## 2013-07-12 DIAGNOSIS — G911 Obstructive hydrocephalus: Secondary | ICD-10-CM | POA: Diagnosis not present

## 2013-07-12 DIAGNOSIS — N179 Acute kidney failure, unspecified: Secondary | ICD-10-CM | POA: Diagnosis not present

## 2013-07-12 HISTORY — DX: Unspecified dementia, unspecified severity, without behavioral disturbance, psychotic disturbance, mood disturbance, and anxiety: F03.90

## 2013-07-12 LAB — URINE MICROSCOPIC-ADD ON

## 2013-07-12 LAB — URINALYSIS, ROUTINE W REFLEX MICROSCOPIC
Bilirubin Urine: NEGATIVE
Glucose, UA: NEGATIVE mg/dL
HGB URINE DIPSTICK: NEGATIVE
Ketones, ur: NEGATIVE mg/dL
Leukocytes, UA: NEGATIVE
Nitrite: NEGATIVE
PH: 5.5 (ref 5.0–8.0)
Protein, ur: 30 mg/dL — AB
SPECIFIC GRAVITY, URINE: 1.029 (ref 1.005–1.030)
UROBILINOGEN UA: 1 mg/dL (ref 0.0–1.0)

## 2013-07-12 LAB — COMPREHENSIVE METABOLIC PANEL
ALT: <5 U/L (ref 0–53)
AST: 13 U/L (ref 0–37)
Albumin: 4 g/dL (ref 3.5–5.2)
Alkaline Phosphatase: 85 U/L (ref 39–117)
BUN: 28 mg/dL — ABNORMAL HIGH (ref 6–23)
CALCIUM: 9.4 mg/dL (ref 8.4–10.5)
CO2: 23 mEq/L (ref 19–32)
CREATININE: 1.57 mg/dL — AB (ref 0.50–1.35)
Chloride: 102 mEq/L (ref 96–112)
GFR calc non Af Amer: 39 mL/min — ABNORMAL LOW (ref >90)
GFR, EST AFRICAN AMERICAN: 45 mL/min — AB (ref >90)
Glucose, Bld: 99 mg/dL (ref 70–99)
Potassium: 4.4 mEq/L (ref 3.7–5.3)
SODIUM: 138 meq/L (ref 137–147)
TOTAL PROTEIN: 7.8 g/dL (ref 6.0–8.3)
Total Bilirubin: 0.4 mg/dL (ref 0.3–1.2)

## 2013-07-12 LAB — CBC
HCT: 39.6 % (ref 39.0–52.0)
Hemoglobin: 12.6 g/dL — ABNORMAL LOW (ref 13.0–17.0)
MCH: 30 pg (ref 26.0–34.0)
MCHC: 31.8 g/dL (ref 30.0–36.0)
MCV: 94.3 fL (ref 78.0–100.0)
PLATELETS: 221 10*3/uL (ref 150–400)
RBC: 4.2 MIL/uL — AB (ref 4.22–5.81)
RDW: 14 % (ref 11.5–15.5)
WBC: 6.1 10*3/uL (ref 4.0–10.5)

## 2013-07-12 LAB — GLUCOSE, CAPILLARY: GLUCOSE-CAPILLARY: 124 mg/dL — AB (ref 70–99)

## 2013-07-12 LAB — TROPONIN I

## 2013-07-12 MED ORDER — SODIUM CHLORIDE 0.9 % IV BOLUS (SEPSIS)
500.0000 mL | Freq: Once | INTRAVENOUS | Status: AC
  Administered 2013-07-12: 500 mL via INTRAVENOUS

## 2013-07-12 NOTE — ED Provider Notes (Signed)
TIME SEEN: 6:25 PM  CHIEF COMPLAINT: Altered mental status  HPI: Patient is an 80 year old male with history of hypertension, hyperlipidemia, Parkinson's, dementia who presents the emergency department with altered mental status that started yesterday. Patient's daughter provides most of the history. She reports that he has not been sleeping well the past 2 days. He's had urinary frequency. He's had increased hallucinations. She states he is more confused than normal. No recent fevers, cough, vomiting or diarrhea. No recent falls. He is on anticoagulation. They contacted their primary care physician, Dr. Kara Dies with [facility] physicians instructed him to come to the emergency department. Daughter reports the patient did have an extra dose of Sinemet yesterday as recommended by his neurologist. He has not been on any new medications.   Patient denies any pain.   ROS: Level V caveat for dementia  PAST MEDICAL HISTORY/PAST SURGICAL HISTORY:  Past Medical History  Diagnosis Date  . Hypertension   . Hypercholesteremia   . Parkinson disease   . Osteoarthritis   . Enlarged prostate   . Dementia     MEDICATIONS:  Prior to Admission medications   Medication Sig Start Date End Date Taking? Authorizing Provider  acetaminophen (TYLENOL) 500 MG tablet Take 500 mg by mouth every 6 (six) hours as needed.   Yes Historical Provider, MD  aspirin 81 MG tablet Take 81 mg by mouth daily.   Yes Historical Provider, MD  carbidopa-levodopa (SINEMET IR) 25-100 MG per tablet 1.5 tablets 4 times a day 03/03/13  Yes Hulen Luster, DO  finasteride (PROSCAR) 5 MG tablet Take 5 mg by mouth daily.   Yes Historical Provider, MD  Multiple Vitamins-Minerals (MULTIVITAMIN WITH MINERALS) tablet Take 0.5 tablets by mouth daily.   Yes Historical Provider, MD  QUEtiapine (SEROQUEL) 25 MG tablet Take 1 tablet (25 mg total) by mouth 2 (two) times daily. 05/17/13  Yes Hulen Luster, DO  rivastigmine (EXELON) 9.5  mg/24hr Place 1 patch (9.5 mg total) onto the skin daily. 03/03/13  Yes Hulen Luster, DO  tamsulosin (FLOMAX) 0.4 MG CAPS capsule Take 0.4 mg by mouth daily after supper.   Yes Historical Provider, MD    ALLERGIES:  No Known Allergies  SOCIAL HISTORY:  History  Substance Use Topics  . Smoking status: Former Research scientist (life sciences)  . Smokeless tobacco: Never Used  . Alcohol Use: No    FAMILY HISTORY: Family History  Problem Relation Age of Onset  . Hypertension Mother   . Alzheimer's disease Maternal Grandmother   . Prostate cancer Brother     EXAM: BP 174/92  Pulse 85  Temp(Src) 98.4 F (36.9 C) (Oral)  Resp 16  SpO2 100% CONSTITUTIONAL: Alert and oriented to person only and responds appropriately to questions. Well-appearing; well-nourished, elderly, no apparent distress HEAD: Normocephalic EYES: Conjunctivae clear, PERRL ENT: normal nose; no rhinorrhea; moist mucous membranes; pharynx without lesions noted NECK: Supple, no meningismus, no LAD  CARD: RRR; S1 and S2 appreciated; no murmurs, no clicks, no rubs, no gallops RESP: Normal chest excursion without splinting or tachypnea; breath sounds clear and equal bilaterally; no wheezes, no rhonchi, no rales,  ABD/GI: Normal bowel sounds; non-distended; soft, non-tender, no rebound, no guarding BACK:  The back appears normal and is non-tender to palpation, there is no CVA tenderness EXT: Normal ROM in all joints; non-tender to palpation; no edema; normal capillary refill; no cyanosis    SKIN: Normal color for age and race; warm NEURO: Moves all extremities equally, cranial nerves II through XII  intact, sensation to light touch intact diffusely, no pronator drift PSYCH: The patient's mood and manner are appropriate. Grooming and personal hygiene are appropriate.  MEDICAL DECISION MAKING: Patient here with altered mental status since yesterday. He has no obvious focal neurologic deficit. Patient's labs show creatinine of 1.57 with no old  for comparison. He does have mild elevation of his BUN. Suspect there is mild dehydration. Family reports he does not drink well. Will give IV fluids. Will obtain CT head, chest x-ray and urinalysis.  ED PROGRESS: Patient's urine, head CT and chest x-ray are unremarkable. Suspect his symptoms today may be due to mild dehydration and getting an extra dose of Sinemet last night. Given patient is now at his baseline is hemodynamically stable, I do not feel he needs to be admitted to the hospital at this time. We'll have the family continue encouraging oral fluids and followup with his primary care physician in the next several days. They are comfortable with this plan. Given strict return precautions.   EKG Interpretation    Date/Time:  Monday July 12 2013 14:19:24 EST Ventricular Rate:  83 PR Interval:  144 QRS Duration: 73 QT Interval:  377 QTC Calculation: 443 R Axis:   30 Text Interpretation:  Sinus rhythm LAE, consider biatrial enlargement Minimal ST elevation, anterior leads Confirmed by WARD  DO, KRISTEN ([id number]) on 07/12/2013 7:19:49 PM             [place], DO 07/12/13 2051

## 2013-07-12 NOTE — Telephone Encounter (Signed)
Called patients daughter to discuss her concerns. Will hold overnight dose of Sinemet. Instructed to try melatonin 5-10mg  nightly for sleep. Instructed her to check with PCP about having a UA, CBC and BMP checked for other possible causes of his AMS. Will follow up once blood work checked.

## 2013-07-12 NOTE — ED Notes (Signed)
Pt. Is unable to use the restroom at this time, but family is aware that we need a specimen when he is able to use the restroom.

## 2013-07-12 NOTE — Discharge Instructions (Signed)
Dehydration, Elderly Dehydration is when you lose more fluids from the body than you take in. Vital organs such as the kidneys, brain, and heart cannot function without a proper amount of fluids and salt. Any loss of fluids from the body can cause dehydration.  Older adults are at a higher risk of dehydration than younger adults. As we age, our bodies are less able to conserve water and do not respond to temperature changes as well. Also, older adults do not become thirsty as easily or quickly. Because of this, older adults often do not realize they need to increase fluids to avoid dehydration.  CAUSES   Vomiting.  Diarrhea.  Excessive sweating.  Excessive urination.  Fever.  Certain medicines, such as blood pressure medicines called diuretics.  Poorly controlled blood sugars. SIGNS AND SYMPTOMS  Mild dehydration:  Thirst.  Dry lips.  Slightly dry mouth. Moderate dehydration:  Very dry mouth.  Sunken eyes.  Skin does not bounce back quickly when lightly pinched and released.  Dark urine and decreased urine production.  Decreased tear production.  Headache. Severe dehydration:  Very dry mouth.  Extreme thirst.  Rapid, weak pulse (more than 100 beats per minute at rest).  Cold hands and feet.  Not able to sweat in spite of heat.  Rapid breathing.  Blue lips.  Confusion and lethargy.  Difficulty being awakened.  Minimal urine production.  No tears. DIAGNOSIS  Your health care provider will diagnose dehydration based on your symptoms and your exam. Blood and urine tests will help confirm the diagnosis. The diagnostic evaluation should also identify the cause of dehydration. TREATMENT  Treatment of mild or moderate dehydration can often be done at home by increasing the amount of fluids that you drink. It is best to drink small amounts of fluid more often. Drinking too much at one time can make vomiting worse. Severe dehydration needs to be treated at the  hospital. You may be given IV fluids that contain water and electrolytes. HOME CARE INSTRUCTIONS   Ask your health care provider about specific rehydration instructions.  Drink enough fluids to keep your urine clear or pale yellow.  Drink small amounts frequently if you have nausea and vomiting.  Eat as you normally do.  Avoid:  Foods or drinks high in sugar.  Carbonated drinks.  Juice.  Extremely hot or cold fluids.  Drinks with caffeine.  Fatty, greasy foods.  Alcohol.  Tobacco.  Overeating.  Gelatin desserts.  Wash your hands well to avoid spreading bacteria and viruses.  Only take over-the-counter or prescription medicines for pain, discomfort, or fever as directed by your health care provider.  Ask your health care provider if you should continue all prescribed and over-the-counter medicines.  Keep all follow-up appointments with your health care provider. SEEK MEDICAL CARE IF:  You have abdominal pain, and it increases or stays in one area (localizes).  You have a rash, stiff neck, or severe headache.  You are irritable, sleepy, or difficult to awaken.  You are weak, dizzy, or extremely thirsty. SEEK IMMEDIATE MEDICAL CARE IF:   You are unable to keep fluids down, or you get worse despite treatment.  You have frequent episodes of vomiting or diarrhea.  You have blood or green matter (bile) in your vomit.  You have blood in your stool, or your stool looks black and tarry.  You have not urinated in 6 8 hours, or you have only urinated a small amount of very dark urine.  You have a  fever.  You faint. MAKE SURE YOU:   Understand these instructions.  Will watch your condition.  Will get help right away if you are not doing well or get worse. Document Released: 08/24/2003 Document Revised: 03/24/2013 Document Reviewed: 02/08/2013 [facility] Patient Information 2014 [place].  Confusion Confusion is the inability to think with your usual  speed or clarity. Confusion may come on quickly or slowly over time. How quickly the confusion comes on depends on the cause. Confusion can be due to any number of causes. CAUSES   Concussion, head injury, or head trauma.  Seizures.  Stroke.  Fever.  Senility.  Heightened emotional states like rage or terror.  Mental illness in which the person loses the ability to determine what is real and what is not (hallucinations).  Infections.  Toxic effects from alcohol, drugs, or prescription medicines.  Dehydration and an imbalance of salts in the body (electrolytes).  Lack of sleep.  Low blood sugar (diabetes).  Low levels of oxygen (for example from chronic lung disorders).  Drug interactions or other medication side effects.  Nutritional deficiencies, especially niacin, thiamine, vitamin C, or vitamin B.  Sudden drop in body temperature (hypothermia).  Illness in the elderly. Constipation can result in confusion. An elderly person who is hospitalized may become confused due to change in daily routine. SYMPTOMS  People often describe their thinking as cloudy or unclear when they are confused. Confusion can also include feeling disoriented. That means you are unaware of where or who you are. You may also not know what the date or time is. If confused, you may also have difficulty paying attention, remembering and making decisions. Some people also act aggressively when they are confused.  DIAGNOSIS  The medical evaluation of confusion may include:  Blood and urine tests.  X-rays.  Brain and nervous system tests.  Analyzing your brain waves (electroencphalogram or EEG).  A special X-ray (MRI) of your head or other special studies. Your physician will ask questions such as:  Do you get days and nights mixed up?  Are you awake during regular sleep times?  Do you have trouble recognizing people?  Do you know where you are?  Do you know the date and time?  Does the  confusion come and go?  Is the confusion quickly getting worse?  Has there been a recent illness?  Has there been a recent head injury?  Are you diabetic?  Do you have a lung disorder?  What medication are you taking?  Have you taken drugs or alcohol? TREATMENT  An admission to the hospital may not be needed, but a confused person should not be left alone. Stay with a family member or friend until the confusion clears. Avoid alcohol, pain relievers or sedative drugs until you have fully recovered. Do not drive until your caregiver says it is okay. HOME CARE INSTRUCTIONS What family and friends can do:  To find out if someone is confused ask him or her their name, age, and the date. If the person is unsure or answers incorrectly, he or she is confused.  Always introduce yourself, no matter how well the person knows you.  Often remind the person of his or her location.  Place a calendar and clock near the confused person.  Talk about current events and plans for the day.  Try to keep the environment calm, quiet and peaceful.  Make sure the patient keeps follow up appointments with their physician. PREVENTION  Ways to prevent confusion:  Avoid alcohol.  Eat a balanced diet.  Get enough sleep.  Do not become isolated. Spend time with other people and make plans for your days.  Keep careful watch on your blood sugar levels if you are diabetic. SEEK IMMEDIATE MEDICAL CARE IF:   You develop severe headaches, repeated vomiting, seizures, blackouts or slurred speech.  There is increasing confusion, weakness, numbness, restlessness or personality changes.  You develop a loss of balance, have marked dizziness, feel uncoordinated or fall.  You have delusions, hallucinations or develop severe anxiety.  Your family members think you need to be rechecked. Document Released: 07/11/2004 Document Revised: 08/26/2011 Document Reviewed: 03/08/2008 [facility] Patient Information  2014 [place], [place].    Your kidney function, creatinine, was 1.57 today. This may be your baseline as we have no old for comparison but it may also be due to mild dehydration. We'll continue IV fluids in the emergency department. Please continue to drink plenty of fluids and followup with your primary care physician in one to 2 days for recheck. If you develop fever, chest pain or shortness of breath, vomiting and cannot keep down fluids, numbness or weakness on one side your body, increasing confusion or any other symptoms concerning to you, please return to the emergency department.

## 2013-07-12 NOTE — Telephone Encounter (Signed)
Spoke with daughter (Ms Cherylann Parr) and she did give patient the extra tablet of carbidopa-levodopa per Dr Hazle Quant LOV notes and he was up all night and has not gone back to sleep today(really bad night), was hallucinating today as well.

## 2013-07-12 NOTE — ED Notes (Signed)
Per Pt's daughter, Pt has been "a little off, since yesterday."  Sts he has not been sleeping and has increased hallucinations.  Pt's neurologist suggested he visit his PCP, but they could not see him today.  Hx of dementia.

## 2013-07-12 NOTE — ED Notes (Signed)
Patient transported to X-ray 

## 2013-07-12 NOTE — Telephone Encounter (Signed)
PT STAYING UP ALL NIGHT--NOT SLEEPING--CAN RX BE CALLED IN OR WHAT CAN BE DONE.

## 2013-07-13 ENCOUNTER — Telehealth: Payer: Self-pay | Admitting: *Deleted

## 2013-07-13 DIAGNOSIS — IMO0001 Reserved for inherently not codable concepts without codable children: Secondary | ICD-10-CM | POA: Diagnosis not present

## 2013-07-13 DIAGNOSIS — G20 Parkinson's disease: Secondary | ICD-10-CM | POA: Diagnosis not present

## 2013-07-13 NOTE — Telephone Encounter (Signed)
Returned call and verbal order placed with Roselie Awkward.

## 2013-07-15 DIAGNOSIS — IMO0001 Reserved for inherently not codable concepts without codable children: Secondary | ICD-10-CM | POA: Diagnosis not present

## 2013-07-15 DIAGNOSIS — G20 Parkinson's disease: Secondary | ICD-10-CM | POA: Diagnosis not present

## 2013-07-19 DIAGNOSIS — IMO0001 Reserved for inherently not codable concepts without codable children: Secondary | ICD-10-CM | POA: Diagnosis not present

## 2013-07-19 DIAGNOSIS — G20 Parkinson's disease: Secondary | ICD-10-CM | POA: Diagnosis not present

## 2013-07-23 DIAGNOSIS — IMO0001 Reserved for inherently not codable concepts without codable children: Secondary | ICD-10-CM | POA: Diagnosis not present

## 2013-07-23 DIAGNOSIS — G20 Parkinson's disease: Secondary | ICD-10-CM | POA: Diagnosis not present

## 2013-07-26 DIAGNOSIS — G20 Parkinson's disease: Secondary | ICD-10-CM | POA: Diagnosis not present

## 2013-07-26 DIAGNOSIS — IMO0001 Reserved for inherently not codable concepts without codable children: Secondary | ICD-10-CM | POA: Diagnosis not present

## 2013-07-29 DIAGNOSIS — IMO0001 Reserved for inherently not codable concepts without codable children: Secondary | ICD-10-CM | POA: Diagnosis not present

## 2013-07-29 DIAGNOSIS — G20 Parkinson's disease: Secondary | ICD-10-CM | POA: Diagnosis not present

## 2013-08-03 DIAGNOSIS — IMO0001 Reserved for inherently not codable concepts without codable children: Secondary | ICD-10-CM | POA: Diagnosis not present

## 2013-08-03 DIAGNOSIS — G20 Parkinson's disease: Secondary | ICD-10-CM | POA: Diagnosis not present

## 2013-08-05 DIAGNOSIS — IMO0001 Reserved for inherently not codable concepts without codable children: Secondary | ICD-10-CM | POA: Diagnosis not present

## 2013-08-05 DIAGNOSIS — G20 Parkinson's disease: Secondary | ICD-10-CM | POA: Diagnosis not present

## 2013-10-01 ENCOUNTER — Other Ambulatory Visit: Payer: Self-pay | Admitting: Neurology

## 2013-10-05 ENCOUNTER — Other Ambulatory Visit: Payer: Self-pay | Admitting: Neurology

## 2013-10-13 ENCOUNTER — Ambulatory Visit: Payer: Medicare Other | Admitting: Neurology

## 2013-11-12 ENCOUNTER — Encounter: Payer: Self-pay | Admitting: Neurology

## 2013-11-12 ENCOUNTER — Ambulatory Visit (INDEPENDENT_AMBULATORY_CARE_PROVIDER_SITE_OTHER): Payer: Medicare Other | Admitting: Neurology

## 2013-11-12 VITALS — BP 142/79 | HR 77 | Ht 72.5 in | Wt 185.0 lb

## 2013-11-12 DIAGNOSIS — G20 Parkinson's disease: Secondary | ICD-10-CM

## 2013-11-12 MED ORDER — QUETIAPINE FUMARATE 25 MG PO TABS: 25.0000 mg | ORAL_TABLET | Freq: Two times a day (BID) | ORAL | Status: DC

## 2013-11-12 MED ORDER — DONEPEZIL HCL 10 MG PO TABS: 10.0000 mg | ORAL_TABLET | Freq: Every day | ORAL | Status: DC

## 2013-11-12 NOTE — Patient Instructions (Signed)
Overall you are doing fairly well but I do want to suggest a few things today:   Remember to drink plenty of fluid, eat healthy meals and do not skip any meals. Try to eat protein with a every meal and eat a healthy snack such as fruit or nuts in between meals. Try to keep a regular sleep-wake schedule and try to exercise daily, particularly in the form of walking, 20-30 minutes a day, if you can.   As far as your medications are concerned, I would like to suggest the following: 1)We will not make any changes to your Sinemet at this time 2)Consider stopping the Exelon and switching over to Aricept 10mg  nightly 3)A refill of your Seroquel was sent to your pharmacy  I would like to see you back in 6 monts, sooner if we need to. Please call us with any interim questions, concerns, problems, updates or refill requests.   My clinical assistant and will answer any of your questions and relay your messages to me and also relay most of my messages to you.   Our phone number is [phone]. We also have an after hours call service for urgent matters and there is a physician on-call for urgent questions. For any emergencies you know to call 911 or go to the nearest emergency room

## 2013-11-12 NOTE — Progress Notes (Signed)
[facility]  Provider:  Dr Chad Cooke Referring Provider: [name], MD Primary Care Physician:  Chad Kroner, MD  CC:  PD  HPI:  Chad Cooke is a 80 y.o. male here as a follow from Chad Cooke for transfer of care in PD. Last visit was 06/2013 at which time her Sinemet was changed to IR 4x a day and she was changed to Exelon 9.5mg . Since last visit he thinks he has been doing good and bad. He is having trouble staying asleep at night, gets up frequently overnight. He is currently taking melatonin 3mg  nightly. He will talk in his sleep and move around frequently. He notes his walking is slow, no falls, uses his walker most of the time. He continues on Exelon 9.5mg  patch, they notice a difference in his memory but no improvement in his walking. They do not feel his walking difficulties are tied to his medication wearing.   Continues to take SInemet at 8am, noon, 4pm and 9pm, takes 1.5 tabs at each dose. Daughter does notice a change if he gets his medication late, but otherwise does not notice any wearing off. No dyskinesias noted. Will have some mild visual hallucinations. Continues on Seroquel 25mg  twice a day.    Initial visit 02/2013: Diagnosed with Parkinson's around 10 years ago. Initial symptoms were shuffling of gait in slow quiet speech. Per his daughter he is a slowly progressing since then. Main concern today is walking, feels full unsteady on his feet. No recent falls, though has had falls in the past. Walking fluctuates throughout the day. Currently taking Sinemet CR 50/203 times a day. Also on Aricept 10 mg daily. He is taking the Sinemet at 8:30 AM, 12:30 and 9 PM. Notes motor fluctuations throughout the day. Notes that Aricept makes him feel very groggy. Notes stiffness and cramping of his muscles, rest tremor worse in left hand. No difficulty with swallowing, no choking. Does note some excessive saliva production but not bothersome at this time. 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  Review of Systems: Out of a complete 14 system review, the patient complains of only the following symptoms, and all other reviewed systems are negative. Positive for frequency of urination walking difficulties  History   Social History  . Marital Status: Married    Spouse Name: Chad Cooke    Number of Children: 1  . Years of Education: Bachelor   Occupational History  . Retired    Social History Main Topics  . Smoking status: Former Research scientist (life sciences)  . Smokeless tobacco: Never Used  . Alcohol Use: No  . Drug Use: No  . Sexual Activity: Not on file   Other Topics Concern  . Not on file   Social History Narrative   Patient is married, Chad Cooke), has 1child   Patient is retired.    Patient is right handed   Education level is Bachelor's degree    Caffeine consumption is 0          Family History  Problem Relation Age of Onset  . Hypertension Mother   . Alzheimer's disease Maternal Grandmother   . Prostate cancer Brother     Past Medical History  Diagnosis Date  . Hypertension   . Hypercholesteremia   . Parkinson disease   . Osteoarthritis   . Enlarged prostate   . Dementia     Past Surgical History  Procedure Laterality Date  . None  Current Outpatient Prescriptions  Medication Sig Dispense Refill  . acetaminophen (TYLENOL) 500 MG tablet Take 500 mg by mouth every 6 (six) hours as needed.      Marland Kitchen aspirin 81 MG tablet Take 81 mg by mouth daily.      . carbidopa-levodopa (SINEMET IR) 25-100 MG per tablet TALE 1 1/2 TABLETS BY MOUTH 4 TIMES DAILY.  180 tablet  6  . finasteride (PROSCAR) 5 MG tablet Take 5 mg by mouth daily.      . Melatonin 3 MG CAPS Take 3 mg by mouth.      . Multiple Vitamins-Minerals (MULTIVITAMIN WITH MINERALS) tablet Take 0.5 tablets by mouth daily.      . QUEtiapine (SEROQUEL) 25 MG tablet TAKE 1 TABLET BY MOUTH TWICE A DAY  60 tablet  0  . rivastigmine (EXELON) 9.5 mg/24hr  Place 1 patch (9.5 mg total) onto the skin daily.  30 patch  12  . tamsulosin (FLOMAX) 0.4 MG CAPS capsule Take 0.4 mg by mouth daily after supper.       No current facility-administered medications for this visit.    Allergies as of 11/12/2013  . (No Known Allergies)    Vitals: BP 142/79  Pulse 77  Ht 6' 0.5" (1.842 m)  Wt 185 lb (83.915 kg)  BMI 24.73 kg/m2 Last Weight:  Wt Readings from Last 1 Encounters:  11/12/13 185 lb (83.915 kg)   Last Height:   Ht Readings from Last 1 Encounters:  11/12/13 6' 0.5" (1.842 m)     Physical exam: Exam: Gen: NAD, conversant Eyes: anicteric sclerae, moist conjunctivae HENT: [place] Neck: Trachea midline; supple,  Lungs: CTA, no wheezing, rales, rhonic                          CV: RRR, no MRG Abdomen: Soft, non-tender;  Extremities: No peripheral edema  Skin: Normal temperature, no rash,  Psych: Appropriate affect, pleasant  Neuro: MS: AA&Ox3, appropriately interactive, normal affect   Speech: fluent w/o paraphasic error  Memory: good recent and remote recall  CN: PERRL, EOMI no nystagmus, no ptosis, sensation intact to LT V1-V3 bilat, face symmetric, no weakness, hearing grossly intact, palate elevates symmetrically, shoulder shrug 5/5 bilat,  tongue protrudes midline, no fasiculations noted.  Motor: normal bulk and tone Strength: 5/5  In all extremities  Reflexes: symmetrical, bilat downgoing toes  Sens: LT intact in all extremities  Brief Motor UPDRS  Speech: hypophonic/dysarthria  Facial Expression:masked facies  Tremor: Rest R1 L1 Action/postural R0 L1 Rigidity: RUE 1 LUE 2 Finger taps:   R:2 L:3  Open/close hands: R:2 L:2  Foot taps: R:1 L:2  Arising from Chair: Requires assistance  Gait/FOG: Slow, stooped posture, shuffling gait, requires assistance to stand   Assessment:  After physical and neurologic examination, review of laboratory studies, imaging, neurophysiology testing and  pre-existing records, assessment will be reviewed on the problem list.  Plan:  Treatment plan and additional workup will be reviewed under Problem List.  1)PD 2)dementia-likely PDD 3)RBD 4)Sialorrhea  Chad Cooke is a pleasant 80y/o woman with Parkinson's disease presenting for follow up visit. Main concern at visit today is continued difficulty walking. Daughter notes slow shuffling steps, no falls. Otherwise overall doing well. Daughter expresses concern over cost of Exelon, they have not noticed any additional benefit compared to Aricept. Will not make any changes to Sinemet at this time. Counseled daughter to increase Melatonin to 6mg  nightly, if no improvement will increase to  9mg  nightly. Refill of Seroquel given. Will change from Exelon to Aricept 10mg  nightly due to cost.

## 2013-12-06 ENCOUNTER — Encounter: Payer: Self-pay | Admitting: Neurology

## 2014-01-18 DIAGNOSIS — R609 Edema, unspecified: Secondary | ICD-10-CM | POA: Diagnosis not present

## 2014-01-18 DIAGNOSIS — N400 Enlarged prostate without lower urinary tract symptoms: Secondary | ICD-10-CM | POA: Diagnosis not present

## 2014-01-18 DIAGNOSIS — G20 Parkinson's disease: Secondary | ICD-10-CM | POA: Diagnosis not present

## 2014-01-18 DIAGNOSIS — F0390 Unspecified dementia without behavioral disturbance: Secondary | ICD-10-CM | POA: Diagnosis not present

## 2014-01-18 DIAGNOSIS — I10 Essential (primary) hypertension: Secondary | ICD-10-CM | POA: Diagnosis not present

## 2014-03-25 ENCOUNTER — Other Ambulatory Visit: Payer: Self-pay | Admitting: Neurology

## 2014-03-25 NOTE — Telephone Encounter (Signed)
QUEtiapine (SEROQUEL) 25 MG tablet 180 tablet 3 11/12/2013     Sig - Route: Take 1 tablet (25 mg total) by mouth 2 (two) times daily. - Oral    E-Prescribing Status: Receipt confirmed by pharmacy (11/12/2013 1:57 PM EDT)                Pharmacy    [id number] - [place], [place]

## 2014-05-03 ENCOUNTER — Other Ambulatory Visit: Payer: Self-pay | Admitting: Neurology

## 2014-05-03 NOTE — Telephone Encounter (Signed)
Former Occupational psychologist Patient, has appt with Dr Jaynee Eagles

## 2014-05-06 ENCOUNTER — Other Ambulatory Visit: Payer: Self-pay | Admitting: Neurology

## 2014-05-10 ENCOUNTER — Ambulatory Visit: Payer: Medicare Other | Admitting: Neurology

## 2014-05-16 ENCOUNTER — Ambulatory Visit: Payer: Medicare Other | Admitting: Neurology

## 2014-05-23 ENCOUNTER — Ambulatory Visit (INDEPENDENT_AMBULATORY_CARE_PROVIDER_SITE_OTHER): Payer: Medicare Other | Admitting: Neurology

## 2014-05-23 ENCOUNTER — Encounter: Payer: Self-pay | Admitting: Neurology

## 2014-05-23 VITALS — BP 151/84 | HR 79 | Ht 72.5 in | Wt 181.0 lb

## 2014-05-23 DIAGNOSIS — G20 Parkinson's disease: Secondary | ICD-10-CM | POA: Diagnosis not present

## 2014-05-23 DIAGNOSIS — K117 Disturbances of salivary secretion: Secondary | ICD-10-CM | POA: Diagnosis not present

## 2014-05-23 DIAGNOSIS — F0280 Dementia in other diseases classified elsewhere without behavioral disturbance: Secondary | ICD-10-CM | POA: Diagnosis not present

## 2014-05-23 DIAGNOSIS — G3183 Dementia with Lewy bodies: Secondary | ICD-10-CM | POA: Diagnosis not present

## 2014-05-23 MED ORDER — CARBIDOPA-LEVODOPA 25-100 MG PO TABS: 1.5000 | ORAL_TABLET | Freq: Four times a day (QID) | ORAL | Status: DC

## 2014-05-23 MED ORDER — QUETIAPINE FUMARATE 25 MG PO TABS: 25.0000 mg | ORAL_TABLET | Freq: Two times a day (BID) | ORAL | Status: DC

## 2014-05-23 NOTE — Progress Notes (Signed)
[name]    Provider:  Dr Jaynee Eagles Referring Provider: [name], MD Primary Care Physician:  [name], MD  CC:  Parkinson's Disease  HPI:  Chad Cooke is a 80 y.o. male here as a referral from Dr. Moreen Fowler for PD. He is a former patient of Dr, Janann Colonel.   The Exelon patch was expensive so they changed back to Aricept. No falls. No side effects fro the medication. He has episodes where he is "out there" seeing people that are not there but then that resolves. No falls. Still shuffling, still with bradycardia. He has been on tid for 8 months, 1.5 tabs. Doesn't notice when it kicks in. He does walk better. Has been on the current dose for at least 6 months. No side effects like dizziness, low blood pressure. Melatonin has worked but he hasn't needed it in a while. Sometimes he is agitated at night and melatonin helps. No Dyskinesias. He is on the seroquel twice daily and tolerating it fine. Family notes some tremors in between doses. Still hypophonia. No swallowing problems. No choking. Separating the Sinemet from protein. He complains of his legs and knees giving out. No excess saliva. Family is here with him and provides all of the information.   Last visit 11/12/2013: Chad Cooke is a 80 y.o. male here as a follow from Dr. Moreen Fowler for transfer of care in PD. Last visit was 06/2013 at which time her Sinemet was changed to IR 4x a day and she was changed to Exelon 9.5mg . Since last visit he thinks he has been doing good and bad. He is having trouble staying asleep at night, gets up frequently overnight. He is currently taking melatonin 3mg  nightly. He will talk in his sleep and move around frequently. He notes his walking is slow, no falls, uses his walker most of the time. He continues on Exelon 9.5mg  patch, they notice a difference in his memory but no improvement in his walking. They do not feel his walking difficulties are tied to his medication wearing.   Continues to  take SInemet at 8am, noon, 4pm and 9pm, takes 1.5 tabs at each dose. Daughter does notice a change if he gets his medication late, but otherwise does not notice any wearing off. No dyskinesias noted. Will have some mild visual hallucinations. Continues on Seroquel 25mg  twice a day.    Initial visit 02/2013: Diagnosed with Parkinson's around 10 years ago. Initial symptoms were shuffling of gait in slow quiet speech. Per his daughter he is a slowly progressing since then. Main concern today is walking, feels full unsteady on his feet. No recent falls, though has had falls in the past. Walking fluctuates throughout the day. Currently taking Sinemet CR 50/203 times a day. Also on Aricept 10 mg daily. He is taking the Sinemet at 8:30 AM, 12:30 and 9 PM. Notes motor fluctuations throughout the day. Notes that Aricept makes him feel very groggy. Notes stiffness and cramping of his muscles, rest tremor worse in left hand. No difficulty with swallowing, no choking. Does note some excessive saliva production but not bothersome at this time. 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   Reviewed notes, labs and imaging from outside physicians, which showed: CBC unremarkable, CMP with BUN/Creat 28/1.57.   Review of Systems: Patient complains of symptoms per HPI as well as the following symptoms: Memory loss, tremors, hallucinations. Denies cp/sob. Pertinent negatives per HPI. All others negative.   History  Social History  . Marital Status: Married    Spouse Name: Barbaraann Share    Number of Children: 1  . Years of Education: Bachelor   Occupational History  . Retired    Social History Main Topics  . Smoking status: Former Research scientist (life sciences)  . Smokeless tobacco: Never Used  . Alcohol Use: No  . Drug Use: No  . Sexual Activity: Not on file   Other Topics Concern  . Not on file   Social History Narrative   Patient is married, Barbaraann Share), has 1child   Patient is retired.     Patient is right handed   Education level is Bachelor's degree    Caffeine consumption is 0          Family History  Problem Relation Age of Onset  . Hypertension Mother   . Alzheimer's disease Maternal Grandmother   . Prostate cancer Brother     Past Medical History  Diagnosis Date  . Hypertension   . Hypercholesteremia   . Parkinson disease   . Osteoarthritis   . Enlarged prostate   . Dementia     Past Surgical History  Procedure Laterality Date  . None      Current Outpatient Prescriptions  Medication Sig Dispense Refill  . acetaminophen (TYLENOL) 500 MG tablet Take 500 mg by mouth every 6 (six) hours as needed.    Marland Kitchen aspirin 81 MG tablet Take 81 mg by mouth daily.    . carbidopa-levodopa (SINEMET IR) 25-100 MG per tablet TAKE 1 AND 1/2 TABLET BY MOUTH 4 TIMES A DAY 180 tablet 0  . donepezil (ARICEPT) 10 MG tablet Take 1 tablet (10 mg total) by mouth at bedtime. 90 tablet 3  . finasteride (PROSCAR) 5 MG tablet Take 5 mg by mouth daily.    . Melatonin 3 MG CAPS Take 3 mg by mouth as needed.     . Multiple Vitamins-Minerals (MULTIVITAMIN WITH MINERALS) tablet Take 0.5 tablets by mouth daily.    . QUEtiapine (SEROQUEL) 25 MG tablet Take 1 tablet (25 mg total) by mouth 2 (two) times daily. 180 tablet 3  . tamsulosin (FLOMAX) 0.4 MG CAPS capsule Take 0.4 mg by mouth daily after supper.     No current facility-administered medications for this visit.    Allergies as of 05/23/2014  . (No Known Allergies)    Vitals: BP 151/84 mmHg  Pulse 79  Ht 6' 0.5" (1.842 m)  Wt 181 lb (82.101 kg)  BMI 24.20 kg/m2 Last Weight:  Wt Readings from Last 1 Encounters:  05/23/14 181 lb (82.101 kg)   Last Height:   Ht Readings from Last 1 Encounters:  05/23/14 6' 0.5" (1.842 m)    Physical exam: Exam: Gen: NAD, conversant Eyes: anicteric sclerae, moist conjunctivae  CV: RRR, no MRG Extremities: No peripheral edema  Psych: Appropriate affect,  pleasant  Neuro: MS: AA&Ox3, appropriately interactive, normal affect   Speech: fluent w/o paraphasic error, hypophonic  Memory: impaired recent and remote recall  CN: PERRL, EOMI no nystagmus, no ptosis, sensation intact to LT V1-V3 bilat, face symmetric, no weakness, hearing grossly intact, palate elevates symmetrically, shoulder shrug 5/5 bilat,  tongue protrudes midline, no fasiculations noted.  Motor: increased tone L>R Strength: 5/5 In all extremities Resting and postural tremor  Coordination: Bradykinesia finger tap  Reflexes: symmetrical, bilat downgoing toes  Sens: LT intact in all extremities  Gait: Shuffling, decreased arm swing, slow  Brief Motor UPDRS  Speech: hypophonic/dysarthria  Facial Expression:masked facies   Assessment:  Mr Rodriques is a pleasant 80y/o woman with Parkinson's disease, REM sleep disorder, dementia, Sialorrhea presenting for follow up visit. Main concern at visit today is continued difficulty walking. Daughter notes slow shuffling steps, no falls. Otherwise overall doing well. Daughter expresses concern over cost of Exelon, they have not noticed any additional benefit compared to Aricept. They stopped Exelon and went back to Aricept. Will not make any changes to Sinemet at this time. Counseled daughter to increase Melatonin to 6mg  nightly, if no improvement will increase to 9mg  nightly.     Sarina Ill, MD  [facility] [address] [place], [place] [address]  Phone [phone] Fax [phone]

## 2014-05-23 NOTE — Patient Instructions (Signed)
Overall you are doing fairly well but I do want to suggest a few things today:   Remember to drink plenty of fluid, eat healthy meals and do not skip any meals. Try to eat protein with a every meal and eat a healthy snack such as fruit or nuts in between meals. Try to keep a regular sleep-wake schedule and try to exercise daily, particularly in the form of walking, 20-30 minutes a day, if you can.   As far as your medications are concerned, I would like to suggest the following: 1)We will not make any changes to your Sinemet at this time 2)Continue Aricept 10mg  nightly 3)Continue Seroquel as needed  I would like to see you back in 6 monts, sooner if we need to. Please call us with any interim questions, concerns, problems, updates or refill requests.   My clinical assistant and will answer any of your questions and relay your messages to me and also relay most of my messages to you.   Our phone number is [phone]. We also have an after hours call service for urgent matters and there is a physician on-call for urgent questions. For any emergencies you know to call 911 or go to the nearest emergency room

## 2014-09-12 ENCOUNTER — Other Ambulatory Visit: Payer: Self-pay | Admitting: Neurology

## 2014-09-15 ENCOUNTER — Other Ambulatory Visit: Payer: Self-pay | Admitting: Neurology

## 2014-09-15 NOTE — Telephone Encounter (Signed)
Duplicate

## 2014-10-10 ENCOUNTER — Other Ambulatory Visit: Payer: Self-pay | Admitting: Neurology

## 2014-10-24 ENCOUNTER — Telehealth: Payer: Self-pay | Admitting: Neurology

## 2014-10-24 ENCOUNTER — Encounter (HOSPITAL_COMMUNITY): Payer: Self-pay | Admitting: Emergency Medicine

## 2014-10-24 ENCOUNTER — Emergency Department (HOSPITAL_COMMUNITY)
Admission: EM | Admit: 2014-10-24 | Discharge: 2014-10-24 | Disposition: A | Discharge status: Home / Self Care | Payer: Medicare Other | Attending: Emergency Medicine | Admitting: Emergency Medicine

## 2014-10-24 ENCOUNTER — Emergency Department (HOSPITAL_COMMUNITY): Payer: Medicare Other

## 2014-10-24 DIAGNOSIS — Z8639 Personal history of other endocrine, nutritional and metabolic disease: Secondary | ICD-10-CM | POA: Insufficient documentation

## 2014-10-24 DIAGNOSIS — Z7982 Long term (current) use of aspirin: Secondary | ICD-10-CM | POA: Insufficient documentation

## 2014-10-24 DIAGNOSIS — N400 Enlarged prostate without lower urinary tract symptoms: Secondary | ICD-10-CM | POA: Insufficient documentation

## 2014-10-24 DIAGNOSIS — R531 Weakness: Secondary | ICD-10-CM | POA: Diagnosis not present

## 2014-10-24 DIAGNOSIS — M1990 Unspecified osteoarthritis, unspecified site: Secondary | ICD-10-CM | POA: Diagnosis not present

## 2014-10-24 DIAGNOSIS — F0390 Unspecified dementia without behavioral disturbance: Secondary | ICD-10-CM | POA: Diagnosis not present

## 2014-10-24 DIAGNOSIS — G3183 Dementia with Lewy bodies: Secondary | ICD-10-CM | POA: Insufficient documentation

## 2014-10-24 DIAGNOSIS — Z87891 Personal history of nicotine dependence: Secondary | ICD-10-CM | POA: Diagnosis not present

## 2014-10-24 DIAGNOSIS — R9431 Abnormal electrocardiogram [ECG] [EKG]: Secondary | ICD-10-CM | POA: Diagnosis not present

## 2014-10-24 DIAGNOSIS — Z79899 Other long term (current) drug therapy: Secondary | ICD-10-CM | POA: Diagnosis not present

## 2014-10-24 DIAGNOSIS — R410 Disorientation, unspecified: Secondary | ICD-10-CM | POA: Diagnosis present

## 2014-10-24 DIAGNOSIS — R4182 Altered mental status, unspecified: Secondary | ICD-10-CM | POA: Diagnosis not present

## 2014-10-24 DIAGNOSIS — R030 Elevated blood-pressure reading, without diagnosis of hypertension: Secondary | ICD-10-CM | POA: Diagnosis not present

## 2014-10-24 DIAGNOSIS — S0990XA Unspecified injury of head, initial encounter: Secondary | ICD-10-CM | POA: Diagnosis not present

## 2014-10-24 DIAGNOSIS — I10 Essential (primary) hypertension: Secondary | ICD-10-CM | POA: Insufficient documentation

## 2014-10-24 LAB — BASIC METABOLIC PANEL
Anion gap: 4 — ABNORMAL LOW (ref 5–15)
BUN: 24 mg/dL — ABNORMAL HIGH (ref 6–20)
CO2: 26 mmol/L (ref 22–32)
Calcium: 9 mg/dL (ref 8.9–10.3)
Chloride: 109 mmol/L (ref 101–111)
Creatinine, Ser: 1.65 mg/dL — ABNORMAL HIGH (ref 0.61–1.24)
GFR calc Af Amer: 42 mL/min — ABNORMAL LOW (ref >60)
GFR calc non Af Amer: 36 mL/min — ABNORMAL LOW (ref >60)
Glucose, Bld: 99 mg/dL (ref 70–99)
Potassium: 4.5 mmol/L (ref 3.5–5.1)
Sodium: 139 mmol/L (ref 135–145)

## 2014-10-24 LAB — CBC WITH DIFFERENTIAL/PLATELET
Basophils Absolute: 0 10*3/uL (ref 0.0–0.1)
Basophils Relative: 1 % (ref 0–1)
Eosinophils Absolute: 0.2 10*3/uL (ref 0.0–0.7)
Eosinophils Relative: 4 % (ref 0–5)
HCT: 43.1 % (ref 39.0–52.0)
Hemoglobin: 13.7 g/dL (ref 13.0–17.0)
Lymphocytes Relative: 23 % (ref 12–46)
Lymphs Abs: 1.4 10*3/uL (ref 0.7–4.0)
MCH: 30.6 pg (ref 26.0–34.0)
MCHC: 31.8 g/dL (ref 30.0–36.0)
MCV: 96.2 fL (ref 78.0–100.0)
Monocytes Absolute: 0.7 10*3/uL (ref 0.1–1.0)
Monocytes Relative: 12 % (ref 3–12)
Neutro Abs: 3.8 10*3/uL (ref 1.7–7.7)
Neutrophils Relative %: 62 % (ref 43–77)
Platelets: 235 10*3/uL (ref 150–400)
RBC: 4.48 MIL/uL (ref 4.22–5.81)
RDW: 13.6 % (ref 11.5–15.5)
WBC: 6.2 10*3/uL (ref 4.0–10.5)

## 2014-10-24 LAB — URINALYSIS, ROUTINE W REFLEX MICROSCOPIC
Bilirubin Urine: NEGATIVE
Glucose, UA: NEGATIVE mg/dL
Hgb urine dipstick: NEGATIVE
Ketones, ur: NEGATIVE mg/dL
Leukocytes, UA: NEGATIVE
Nitrite: NEGATIVE
Protein, ur: NEGATIVE mg/dL
Specific Gravity, Urine: 1.011 (ref 1.005–1.030)
Urobilinogen, UA: 1 mg/dL (ref 0.0–1.0)
pH: 7.5 (ref 5.0–8.0)

## 2014-10-24 LAB — TROPONIN I: Troponin I: <0.03 ng/mL (ref <0.031)

## 2014-10-24 NOTE — Telephone Encounter (Signed)
Daughter called and stated that her father has been "off" the past few weeks and she is worried about him. He left the house yesterday and fell. She would like to speak with someone regarding these issues.

## 2014-10-24 NOTE — Telephone Encounter (Signed)
Spoke to patient. Recommended f/u with pcp to rule out UTI or anything else metabolic for 2 weeks of being "off". Then they can follow up with me sooner if needed.  Terrence Dupont - would you ensure I have a 30 minute follow up? And call them later this week to see if they need to come in sooner. Thank you!

## 2014-10-24 NOTE — ED Notes (Signed)
Bed: BW46 Expected date:  Expected time:  Means of arrival:  Comments: EMS/16M/hyppertensive/weakness

## 2014-10-24 NOTE — Telephone Encounter (Signed)
Daughter stated father is seeing things and reaching for things that are not there. Pt left house by himself yesterday and fell. He did not hit head but scraped up knee and hands. Nothing broken. Daughter says pt stating "I  Want to go home" many times and is more agitated. I told her I would let Dr. Jaynee Eagles know and we would call her back. She verbalized understanding.

## 2014-10-24 NOTE — ED Notes (Signed)
Pt arrived via EMS with report of coming from PMD with c/o of generalized weakness, ?UTI and progressive worsening of Dementia. Pt fell yesterday but did not hit his head. MAEE. Denies dizziness/lightheadedness.

## 2014-10-24 NOTE — Discharge Instructions (Signed)

## 2014-10-27 NOTE — Telephone Encounter (Signed)
Called and spoke with daughter and she stated patient has no UTI, no dehydration and was okay. She stated Dr. Edward Qualia setting up for pt to get wheelchair and once she knows when he gets this, she will call and make appointment. She stated it is too hard for her to transport pt with no assistance. Dr. Jaynee Eagles aware and okay with this. Told her to call back when she gets this information. She verbalized understanding.

## 2014-11-02 NOTE — ED Provider Notes (Signed)
CSN: [id number]     Arrival date & time 10/24/14  1631 History   First MD Initiated Contact with Patient 10/24/14 1743     Chief Complaint  Patient presents with  . Weakness     (Consider location/radiation/quality/duration/timing/severity/associated sxs/prior Treatment) HPI   35y male brought in by family for evaluation of waxing/waning confusion. Some generalized weakness. Mechanical fall yesterday the patient is pre-sure you did not hit his head. Denies any headaches, neck or back pain or pain anywhere else that matter. No urinary complaints. No fevers or chills. Appetite has been fair. No recent medication changes.  Past Medical History  Diagnosis Date  . Hypertension   . Hypercholesteremia   . Parkinson disease   . Osteoarthritis   . Enlarged prostate   . Dementia    Past Surgical History  Procedure Laterality Date  . None     Family History  Problem Relation Age of Onset  . Hypertension Mother   . Alzheimer's disease Maternal Grandmother   . Prostate cancer Brother    History  Substance Use Topics  . Smoking status: Former Research scientist (life sciences)  . Smokeless tobacco: Never Used  . Alcohol Use: No    Review of Systems  All systems reviewed and negative, other than as noted in HPI.   Allergies  Review of patient's allergies indicates no known allergies.  Home Medications   Prior to Admission medications   Medication Sig Start Date End Date Taking? Authorizing Provider  acetaminophen (TYLENOL) 500 MG tablet Take 500 mg by mouth every 6 (six) hours as needed for mild pain or moderate pain.    Yes Historical Provider, MD  aspirin 81 MG tablet Take 81 mg by mouth daily.   Yes Historical Provider, MD  carbidopa-levodopa (SINEMET IR) 25-100 MG per tablet Take 1.5 tablets by mouth 4 (four) times daily. 05/23/14  Yes Melvenia Beam, MD  donepezil (ARICEPT) 10 MG tablet TAKE 1 TABLET (10 MG TOTAL) BY MOUTH AT BEDTIME. 09/12/14  Yes Melvenia Beam, MD  finasteride (PROSCAR) 5 MG  tablet Take 5 mg by mouth daily.   Yes Historical Provider, MD  ibuprofen (ADVIL,MOTRIN) 200 MG tablet Take 200 mg by mouth every 6 (six) hours as needed for headache, mild pain or moderate pain.   Yes Historical Provider, MD  Multiple Vitamins-Minerals (MULTIVITAMIN WITH MINERALS) tablet Take 0.5 tablets by mouth daily.   Yes Historical Provider, MD  QUEtiapine (SEROQUEL) 25 MG tablet TAKE 1 TABLET BY MOUTH TWICE A DAY 10/10/14  Yes Melvenia Beam, MD  tamsulosin (FLOMAX) 0.4 MG CAPS capsule Take 0.4 mg by mouth daily.    Yes Historical Provider, MD   BP 191/90 mmHg  Pulse 71  Temp(Src) 98.8 F (37.1 C) (Oral)  Resp 20  SpO2 98% Physical Exam  Constitutional: He appears well-developed and well-nourished. No distress.  HENT:  Head: Normocephalic and atraumatic.  Eyes: Conjunctivae and EOM are normal. Pupils are equal, round, and reactive to light. Right eye exhibits no discharge. Left eye exhibits no discharge.  Neck: Neck supple.  Cardiovascular: Normal rate, regular rhythm and normal heart sounds.  Exam reveals no gallop and no friction rub.   No murmur heard. Pulmonary/Chest: Effort normal and breath sounds normal. No respiratory distress.  Abdominal: Soft. He exhibits no distension. There is no tenderness.  Musculoskeletal: He exhibits no edema or tenderness.  Neurological: He is alert. No cranial nerve deficit. He exhibits normal muscle tone. Coordination normal.  Appears somewhat drowsy, but opens eyes to  conversational voice. Pleasant. Follows commands. No focal motor deficit.  Skin: Skin is warm and dry.  Psychiatric: He has a normal mood and affect. His behavior is normal. Thought content normal.  Nursing note and vitals reviewed.   ED Course  Procedures (including critical care time) Labs Review Labs Reviewed  BASIC METABOLIC PANEL - Abnormal; Notable for the following:    BUN 24 (*)    Creatinine, Ser 1.65 (*)    GFR calc non Af Amer 36 (*)    GFR calc Af Amer 42 (*)     Anion gap 4 (*)    All other components within normal limits  CBC WITH DIFFERENTIAL/PLATELET  URINALYSIS, ROUTINE W REFLEX MICROSCOPIC  TROPONIN I    Imaging Review No results found.   EKG Interpretation   Date/Time:  Monday Oct 24 2014 16:55:31 EDT Ventricular Rate:  68 PR Interval:  142 QRS Duration: 82 QT Interval:  411 QTC Calculation: 437 R Axis:   36 Text Interpretation:  Sinus rhythm Probable left atrial enlargement  Minimal ST elevation, anterior leads ED PHYSICIAN INTERPRETATION AVAILABLE  IN [facility] Confirmed by TEST, Record ([id number]) on 10/26/2014 6:56:27  AM      MDM   Final diagnoses:  Altered mental status, unspecified altered mental status type    11y male with a change in his mental status. Etiology is not completely clear. Patient has a nonfocal neuro exam. Imaging the head does not show any acute abnormality. He is afebrile. Workup unremarkable. No evidence of UTI. Patient and family comfortable with discharge at this time. Outpatient follow-up with neurology her PCP otherwise.    Virgel Manifold, MD 11/02/14 [phone]

## 2014-11-22 ENCOUNTER — Ambulatory Visit (INDEPENDENT_AMBULATORY_CARE_PROVIDER_SITE_OTHER): Payer: Medicare Other | Admitting: Neurology

## 2014-11-22 ENCOUNTER — Encounter: Payer: Self-pay | Admitting: Neurology

## 2014-11-22 ENCOUNTER — Telehealth: Payer: Self-pay | Admitting: Neurology

## 2014-11-22 VITALS — BP 162/89 | HR 68 | Temp 98.6°F | Ht 72.5 in

## 2014-11-22 DIAGNOSIS — G20 Parkinson's disease: Secondary | ICD-10-CM

## 2014-11-22 MED ORDER — CARBIDOPA-LEVODOPA 25-100 MG PO TABS: 1.5000 | ORAL_TABLET | Freq: Four times a day (QID) | ORAL | Status: DC

## 2014-11-22 MED ORDER — CARBIDOPA-LEVODOPA ER 25-100 MG PO TBCR: 1.0000 | EXTENDED_RELEASE_TABLET | Freq: Every day | ORAL | Status: DC

## 2014-11-22 NOTE — Patient Instructions (Signed)
Overall you are doing fairly well but I do want to suggest a few things today:   Remember to drink plenty of fluid, eat healthy meals and do not skip any meals. Try to eat protein with a every meal and eat a healthy snack such as fruit or nuts in between meals. Try to keep a regular sleep-wake schedule and try to exercise daily, particularly in the form of walking, 20-30 minutes a day, if you can.   As far as your medications are concerned, I would like to suggest:  Take Carbidopa/Levodopa 25-100 every three hours in the day 1.5 tabs (8am,11am,2pm and 5pm) Carbidopa/levodopa long-acting an hour before bed. (8pm)  I would like to see you back in 4-6 months, sooner if we need to. Please call us with any interim questions, concerns, problems, updates or refill requests.   Please also call us for any test results so we can go over those with you on the phone.  My clinical assistant and will answer any of your questions and relay your messages to me and also relay most of my messages to you.   Our phone number is [phone]. We also have an after hours call service for urgent matters and there is a physician on-call for urgent questions. For any emergencies you know to call 911 or go to the nearest emergency room

## 2014-11-22 NOTE — Telephone Encounter (Signed)
He takes Sinemet IR during the day and Sinemet ER before bed. Discussed with pharmacy, orders are acurate thanks

## 2014-11-22 NOTE — Progress Notes (Signed)
[name]    Provider:  Dr Jaynee Eagles Referring Provider: Antony Contras, MD Primary Care Physician:  [name], MD  CC: Parkinson's Disease  HPI: Chad Cooke is a 81 y.o. male here as a referral from Dr. Moreen Fowler for PD. He is a former patient of Dr. Janann Colonel. He had a fall since last being seen. He is here with his daughter. He "got of the house" and fell down the stairs. He was confused that day, they turned their back for a moment and he was out of the house. He is back to baseline. They have an appointment with neurorehab for an assesment for a wheelchair. He is still shuffling. They can tell when he doesn't take the medication. The medication seems to last the whole 4 hours without significant wearing off.  No dyskinesias. No dizziness or side effects from the medication. He is having poor balance. He is very stiff in the mornings.     Interval update 11/22/2014:  Interval update 05/23/2014: The Exelon patch was expensive so they changed back to Aricept. No falls. No side effects fro the medication. He has episodes where he is "out there" seeing people that are not there but then that resolves. No falls. Still shuffling, still with bradycardia. He has been on tid for 8 months, 1.5 tabs. Doesn't notice when it kicks in. He does walk better. Has been on the current dose for at least 6 months. No side effects like dizziness, low blood pressure. Melatonin has worked but he hasn't needed it in a while. Sometimes he is agitated at night and melatonin helps. No Dyskinesias. He is on the seroquel twice daily and tolerating it fine. Family notes some tremors in between doses. Still hypophonia. No swallowing problems. No choking. Separating the Sinemet from protein. He complains of his legs and knees giving out. No excess saliva. Family is here with him and provides all of the information.   visit 11/12/2013: Chad Cooke is a 81 y.o. male here as a follow from Dr. Moreen Fowler for transfer  of care in PD. Last visit was 06/2013 at which time her Sinemet was changed to IR 4x a day and she was changed to Exelon 9.5mg . Since last visit he thinks he has been doing good and bad. He is having trouble staying asleep at night, gets up frequently overnight. He is currently taking melatonin 3mg  nightly. He will talk in his sleep and move around frequently. He notes his walking is slow, no falls, uses his walker most of the time. He continues on Exelon 9.5mg  patch, they notice a difference in his memory but no improvement in his walking. They do not feel his walking difficulties are tied to his medication wearing.   Continues to take SInemet at 8am, noon, 4pm and 9pm, takes 1.5 tabs at each dose. Daughter does notice a change if he gets his medication late, but otherwise does not notice any wearing off. No dyskinesias noted. Will have some mild visual hallucinations. Continues on Seroquel 25mg  twice a day.    Initial visit 02/2013 with Dr. Janann Colonel: Diagnosed with Parkinson's around 10 years ago. Initial symptoms were shuffling of gait in slow quiet speech. Per his daughter he is a slowly progressing since then. Main concern today is walking, feels full unsteady on his feet. No recent falls, though has had falls in the past. Walking fluctuates throughout the day. Currently taking Sinemet CR 50/203 times a day. Also on Aricept 10 mg daily. He is taking the Sinemet  at 8:30 AM, 12:30 and 9 PM. Notes motor fluctuations throughout the day. Notes that Aricept makes him feel very groggy. Notes stiffness and cramping of his muscles, rest tremor worse in left hand. No difficulty with swallowing, no choking. Does note some excessive saliva production but not bothersome at this time. 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   Reviewed notes, labs and imaging from outside physicians, which showed: CBC unremarkable, CMP with BUN/Creat 28/1.57.    Review of Systems: Patient  complains of symptoms per HPI as well as the following symptoms: No CP, No SOB. Pertinent negatives per HPI. All others negative.   History   Social History  . Marital Status: Married    Spouse Name: Barbaraann Share  . Number of Children: 1  . Years of Education: Bachelor   Occupational History  . Retired    Social History Main Topics  . Smoking status: Former Research scientist (life sciences)  . Smokeless tobacco: Never Used  . Alcohol Use: No  . Drug Use: No  . Sexual Activity: Not on file   Other Topics Concern  . Not on file   Social History Narrative   Patient is married, Barbaraann Share), has 1child   Patient is retired.    Patient is right handed   Education level is Bachelor's degree    Caffeine consumption is 0          Family History  Problem Relation Age of Onset  . Hypertension Mother   . Alzheimer's disease Maternal Grandmother   . Prostate cancer Brother     Past Medical History  Diagnosis Date  . Hypertension   . Hypercholesteremia   . Parkinson disease   . Osteoarthritis   . Enlarged prostate   . Dementia     Past Surgical History  Procedure Laterality Date  . None      Current Outpatient Prescriptions  Medication Sig Dispense Refill  . acetaminophen (TYLENOL) 500 MG tablet Take 500 mg by mouth every 6 (six) hours as needed for mild pain or moderate pain.     Marland Kitchen aspirin 81 MG tablet Take 81 mg by mouth daily.    . carbidopa-levodopa (SINEMET IR) 25-100 MG per tablet Take 1.5 tablets by mouth 4 (four) times daily. 180 tablet 11  . Carbidopa-Levodopa ER (SINEMET CR) 25-100 MG tablet controlled release Take 1 tablet by mouth at bedtime. 30 tablet 11  . donepezil (ARICEPT) 10 MG tablet TAKE 1 TABLET (10 MG TOTAL) BY MOUTH AT BEDTIME. 90 tablet 1  . finasteride (PROSCAR) 5 MG tablet Take 5 mg by mouth daily.    Marland Kitchen ibuprofen (ADVIL,MOTRIN) 200 MG tablet Take 200 mg by mouth every 6 (six) hours as needed for headache, mild pain or moderate pain.    . Multiple Vitamins-Minerals  (MULTIVITAMIN WITH MINERALS) tablet Take 0.5 tablets by mouth daily.    . QUEtiapine (SEROQUEL) 25 MG tablet TAKE 1 TABLET BY MOUTH TWICE A DAY 180 tablet 1  . tamsulosin (FLOMAX) 0.4 MG CAPS capsule Take 0.4 mg by mouth daily.      No current facility-administered medications for this visit.    Allergies as of 11/22/2014  . (No Known Allergies)    Vitals: BP 162/89 mmHg  Pulse 68  Temp(Src) 98.6 F (37 C)  Ht 6' 0.5" (1.842 m) Last Weight:  Wt Readings from Last 1 Encounters:  05/23/14 181 lb (82.101 kg)   Last Height:   Ht Readings from Last 1 Encounters:  11/22/14 6' 0.5" (1.842 m)    Psych: Appropriate affect, pleasant  Neuro: MS: AA&Ox3, appropriately interactive, normal affect   Speech: fluent w/o paraphasic error, hypophonic  Memory: impaired recent and remote recall  CN: PERRL, EOMI no nystagmus, no ptosis, sensation intact to LT V1-V3 bilat, face symmetric, no weakness, hearing grossly intact, palate elevates symmetrically, shoulder shrug 5/5 bilat,  tongue protrudes midline, no fasiculations noted.  Motor: increased tone L>R Strength: 5/5 In all extremities Resting and postural tremor  Coordination: Bradykinesia finger tap  Reflexes: symmetrical, bilat downgoing toes  Sens: LT intact in all extremities  Gait: Shuffling, decreased arm swing, slow  Brief Motor UPDRS  Speech: hypophonic/dysarthria  Facial Expression:masked facies     Assessment/Plan:  Assessment:  Mr Beem is a pleasant 81y/o woman with Parkinson's disease, REM sleep disorder, dementia, Sialorrhea presenting for follow up visit. Main concern at visit today is continued difficulty walking and stiffness in the morning. Daughter notes slow shuffling steps, one fall. Otherwise overall doing well. Continued Aricept. Will add Sinemet long acting at night.  He will sometimes act out in his sleep but nothing significant.   Will order in-home PT - want advanced care Will add long  acting Carbidopa Levodopa at night. Take Carbidopa/Levodopa 25-100 every three hours in the day 1.5 tabs then long-acting an hour before bed.   Sarina Ill, MD  [facility] [address] [place], [place] [address]  Phone [phone] Fax [phone]  A total of 15 minutes was spent face-to-face with this patient. Over half this time was spent on counseling patient on the Parkinson's disease diagnosis and different diagnostic and therapeutic options available.

## 2014-11-22 NOTE — Telephone Encounter (Signed)
[phone]  - Mimi with [facility] needs a call back to confirm a medication dosage.  They received 2 faxes for the same medication with 2 different instructions. [phone]

## 2014-11-26 ENCOUNTER — Other Ambulatory Visit: Payer: Self-pay | Admitting: Neurology

## 2014-11-26 DIAGNOSIS — Z7409 Other reduced mobility: Secondary | ICD-10-CM

## 2014-11-26 DIAGNOSIS — R270 Ataxia, unspecified: Secondary | ICD-10-CM

## 2014-11-26 DIAGNOSIS — G20 Parkinson's disease: Secondary | ICD-10-CM

## 2014-12-01 ENCOUNTER — Telehealth: Payer: Self-pay | Admitting: Neurology

## 2014-12-01 DIAGNOSIS — R2689 Other abnormalities of gait and mobility: Secondary | ICD-10-CM | POA: Diagnosis not present

## 2014-12-01 DIAGNOSIS — M6281 Muscle weakness (generalized): Secondary | ICD-10-CM | POA: Diagnosis not present

## 2014-12-01 DIAGNOSIS — F0390 Unspecified dementia without behavioral disturbance: Secondary | ICD-10-CM | POA: Diagnosis not present

## 2014-12-01 DIAGNOSIS — Z9181 History of falling: Secondary | ICD-10-CM | POA: Diagnosis not present

## 2014-12-01 DIAGNOSIS — M129 Arthropathy, unspecified: Secondary | ICD-10-CM | POA: Diagnosis not present

## 2014-12-01 DIAGNOSIS — G20 Parkinson's disease: Secondary | ICD-10-CM | POA: Diagnosis not present

## 2014-12-01 DIAGNOSIS — F329 Major depressive disorder, single episode, unspecified: Secondary | ICD-10-CM | POA: Diagnosis not present

## 2014-12-01 NOTE — Telephone Encounter (Signed)
I spoke with Chad Cooke, thanks

## 2014-12-01 NOTE — Telephone Encounter (Signed)
Peter-PT from [place] called.He has done initial visit and will continue with the visits 2x wk for 4 weeks. Also had a drug interaction when entered donepezil (ARICEPT) 10 MG tablet andQUEtiapine (SEROQUEL) 25 MG tablet . Please call and advise at [phone].

## 2014-12-07 DIAGNOSIS — F329 Major depressive disorder, single episode, unspecified: Secondary | ICD-10-CM | POA: Diagnosis not present

## 2014-12-07 DIAGNOSIS — F0390 Unspecified dementia without behavioral disturbance: Secondary | ICD-10-CM | POA: Diagnosis not present

## 2014-12-07 DIAGNOSIS — M6281 Muscle weakness (generalized): Secondary | ICD-10-CM | POA: Diagnosis not present

## 2014-12-07 DIAGNOSIS — M129 Arthropathy, unspecified: Secondary | ICD-10-CM | POA: Diagnosis not present

## 2014-12-07 DIAGNOSIS — G20 Parkinson's disease: Secondary | ICD-10-CM | POA: Diagnosis not present

## 2014-12-07 DIAGNOSIS — R2689 Other abnormalities of gait and mobility: Secondary | ICD-10-CM | POA: Diagnosis not present

## 2014-12-08 ENCOUNTER — Ambulatory Visit: Payer: Medicare Other | Admitting: Physical Therapy

## 2014-12-08 DIAGNOSIS — R2689 Other abnormalities of gait and mobility: Secondary | ICD-10-CM | POA: Diagnosis not present

## 2014-12-08 DIAGNOSIS — F0390 Unspecified dementia without behavioral disturbance: Secondary | ICD-10-CM | POA: Diagnosis not present

## 2014-12-08 DIAGNOSIS — G20 Parkinson's disease: Secondary | ICD-10-CM | POA: Diagnosis not present

## 2014-12-08 DIAGNOSIS — M129 Arthropathy, unspecified: Secondary | ICD-10-CM | POA: Diagnosis not present

## 2014-12-08 DIAGNOSIS — M6281 Muscle weakness (generalized): Secondary | ICD-10-CM | POA: Diagnosis not present

## 2014-12-08 DIAGNOSIS — F329 Major depressive disorder, single episode, unspecified: Secondary | ICD-10-CM | POA: Diagnosis not present

## 2014-12-13 ENCOUNTER — Encounter: Payer: Self-pay | Admitting: *Deleted

## 2014-12-13 NOTE — Progress Notes (Signed)
Faxed signed copies of home health certification and plan of care from [facility] at 1:16pm on 12/13/14. Received fax confirmation.

## 2014-12-13 NOTE — Progress Notes (Signed)
Signed order for OT eval faxed back to [facility], fax # [phone], with fax confirmation received/fim

## 2014-12-14 DIAGNOSIS — F0390 Unspecified dementia without behavioral disturbance: Secondary | ICD-10-CM | POA: Diagnosis not present

## 2014-12-14 DIAGNOSIS — R2689 Other abnormalities of gait and mobility: Secondary | ICD-10-CM | POA: Diagnosis not present

## 2014-12-14 DIAGNOSIS — F329 Major depressive disorder, single episode, unspecified: Secondary | ICD-10-CM | POA: Diagnosis not present

## 2014-12-14 DIAGNOSIS — M6281 Muscle weakness (generalized): Secondary | ICD-10-CM | POA: Diagnosis not present

## 2014-12-14 DIAGNOSIS — M129 Arthropathy, unspecified: Secondary | ICD-10-CM | POA: Diagnosis not present

## 2014-12-14 DIAGNOSIS — G20 Parkinson's disease: Secondary | ICD-10-CM | POA: Diagnosis not present

## 2014-12-16 DIAGNOSIS — F0390 Unspecified dementia without behavioral disturbance: Secondary | ICD-10-CM | POA: Diagnosis not present

## 2014-12-16 DIAGNOSIS — M6281 Muscle weakness (generalized): Secondary | ICD-10-CM | POA: Diagnosis not present

## 2014-12-16 DIAGNOSIS — M129 Arthropathy, unspecified: Secondary | ICD-10-CM | POA: Diagnosis not present

## 2014-12-16 DIAGNOSIS — F329 Major depressive disorder, single episode, unspecified: Secondary | ICD-10-CM | POA: Diagnosis not present

## 2014-12-16 DIAGNOSIS — R2689 Other abnormalities of gait and mobility: Secondary | ICD-10-CM | POA: Diagnosis not present

## 2014-12-16 DIAGNOSIS — G20 Parkinson's disease: Secondary | ICD-10-CM | POA: Diagnosis not present

## 2014-12-23 DIAGNOSIS — M129 Arthropathy, unspecified: Secondary | ICD-10-CM | POA: Diagnosis not present

## 2014-12-23 DIAGNOSIS — F0390 Unspecified dementia without behavioral disturbance: Secondary | ICD-10-CM | POA: Diagnosis not present

## 2014-12-23 DIAGNOSIS — M6281 Muscle weakness (generalized): Secondary | ICD-10-CM | POA: Diagnosis not present

## 2014-12-23 DIAGNOSIS — G20 Parkinson's disease: Secondary | ICD-10-CM | POA: Diagnosis not present

## 2014-12-23 DIAGNOSIS — R2689 Other abnormalities of gait and mobility: Secondary | ICD-10-CM | POA: Diagnosis not present

## 2014-12-23 DIAGNOSIS — F329 Major depressive disorder, single episode, unspecified: Secondary | ICD-10-CM | POA: Diagnosis not present

## 2014-12-24 DIAGNOSIS — G20 Parkinson's disease: Secondary | ICD-10-CM | POA: Diagnosis not present

## 2014-12-24 DIAGNOSIS — F0390 Unspecified dementia without behavioral disturbance: Secondary | ICD-10-CM | POA: Diagnosis not present

## 2014-12-24 DIAGNOSIS — M129 Arthropathy, unspecified: Secondary | ICD-10-CM | POA: Diagnosis not present

## 2014-12-24 DIAGNOSIS — F329 Major depressive disorder, single episode, unspecified: Secondary | ICD-10-CM | POA: Diagnosis not present

## 2014-12-24 DIAGNOSIS — R2689 Other abnormalities of gait and mobility: Secondary | ICD-10-CM | POA: Diagnosis not present

## 2014-12-24 DIAGNOSIS — M6281 Muscle weakness (generalized): Secondary | ICD-10-CM | POA: Diagnosis not present

## 2014-12-27 DIAGNOSIS — F329 Major depressive disorder, single episode, unspecified: Secondary | ICD-10-CM | POA: Diagnosis not present

## 2014-12-27 DIAGNOSIS — F0390 Unspecified dementia without behavioral disturbance: Secondary | ICD-10-CM | POA: Diagnosis not present

## 2014-12-27 DIAGNOSIS — R2689 Other abnormalities of gait and mobility: Secondary | ICD-10-CM | POA: Diagnosis not present

## 2014-12-27 DIAGNOSIS — M6281 Muscle weakness (generalized): Secondary | ICD-10-CM | POA: Diagnosis not present

## 2014-12-27 DIAGNOSIS — G20 Parkinson's disease: Secondary | ICD-10-CM | POA: Diagnosis not present

## 2014-12-27 DIAGNOSIS — M129 Arthropathy, unspecified: Secondary | ICD-10-CM | POA: Diagnosis not present

## 2014-12-29 DIAGNOSIS — M129 Arthropathy, unspecified: Secondary | ICD-10-CM | POA: Diagnosis not present

## 2014-12-29 DIAGNOSIS — M6281 Muscle weakness (generalized): Secondary | ICD-10-CM | POA: Diagnosis not present

## 2014-12-29 DIAGNOSIS — F329 Major depressive disorder, single episode, unspecified: Secondary | ICD-10-CM | POA: Diagnosis not present

## 2014-12-29 DIAGNOSIS — R2689 Other abnormalities of gait and mobility: Secondary | ICD-10-CM | POA: Diagnosis not present

## 2014-12-29 DIAGNOSIS — G20 Parkinson's disease: Secondary | ICD-10-CM | POA: Diagnosis not present

## 2014-12-29 DIAGNOSIS — F0390 Unspecified dementia without behavioral disturbance: Secondary | ICD-10-CM | POA: Diagnosis not present

## 2015-01-14 ENCOUNTER — Telehealth: Payer: Self-pay | Admitting: Neurology

## 2015-01-14 NOTE — Telephone Encounter (Signed)
I am out of the office next week on vacation. If problems are worsening he should contact his primary care. . Please let him know if he calls Terrence Dupont, thanks.

## 2015-01-14 NOTE — Telephone Encounter (Signed)
Pt daughter called in and stated that pt for the last 2 days seems more aggressive, mean, not listen, increased hallucination, picking on things not there. He is on aricept 10mg  Qhs and seroquel 25mg  bid. He took seroquel this morning at 9am.   Daughter denies any fever, cough, urinary urgency or cloudy urine, no obvious discomfort. Eating well and drink well, hydrated and at home with air conditioner. No sign of infection or illness.  I recommended daughter to give him extra half tab (12.5mg ) seroquel for behavior disturbance, and observe. If not effective, and no drowsy sleepy lethargy side effect, can even try another 12.5mg  in the afternoon, say 4pm. She will call back and let us know whether in the future pt seroquel dose needs to be titrated up.   Daughter stated understanding and appreciation. She will call on Monday to discuss with Dr. Jaynee Eagles too.  Rosalin Hawking, MD PhD Stroke Neurology 01/14/2015 11:58 AM

## 2015-01-17 ENCOUNTER — Telehealth: Payer: Self-pay | Admitting: Neurology

## 2015-01-17 DIAGNOSIS — G4752 REM sleep behavior disorder: Secondary | ICD-10-CM

## 2015-01-17 NOTE — Telephone Encounter (Signed)
Pt's daughter called and would like a call back from the Dr. About concerns with medication. Please call and advise [phone]. Call back is not urgent.

## 2015-01-17 NOTE — Telephone Encounter (Signed)
Spoke w/ daughter and she stated she would like Dr. Jaynee Eagles to call her back when she gets back in the office. She stated it was not urgent. On-call doctor had her give pt 1/2 tablet Seroquel on top of dose he already takes d/t being more agitated. I advised her to take him to ER or f/u w/ PCP if new/worsening symptoms occur. She verbalized understanding.

## 2015-01-18 NOTE — Telephone Encounter (Signed)
Chad Cooke, have patient schedule a follow up with me if she needs medication management. If the Seroquel didn't work and it is not urgent, I need to discuss with them in the office during an appointment not over the phone. Need 30 minutes. Thanks.

## 2015-01-18 NOTE — Telephone Encounter (Signed)
Called [name]  (daughter) and set up f/u appt for 01/26/15 at 9am for 30 minutes for check-in at 8:45am.

## 2015-01-18 NOTE — Telephone Encounter (Signed)
Thank you :)

## 2015-01-25 MED ORDER — QUETIAPINE FUMARATE 25 MG PO TABS: 25.0000 mg | ORAL_TABLET | Freq: Three times a day (TID) | ORAL | Status: DC

## 2015-01-25 MED ORDER — CARBIDOPA-LEVODOPA 25-100 MG PO TABS: 1.5000 | ORAL_TABLET | Freq: Four times a day (QID) | ORAL | Status: DC

## 2015-01-25 MED ORDER — CLONAZEPAM 0.25 MG PO TBDP: 0.2500 mg | ORAL_TABLET | Freq: Every day | ORAL | Status: DC

## 2015-01-25 NOTE — Telephone Encounter (Signed)
Dr. Harley Alto Rx clonazepam 0.25mg  to [facility] at [phone] (Fax). Received fax confirmation. I also cancelled appt for tomorrow. Thank you!

## 2015-01-25 NOTE — Telephone Encounter (Signed)
Thank you :)

## 2015-01-25 NOTE — Telephone Encounter (Signed)
Patient is having more behavioral problems. She increased the Seroquel for his behavioral issues. Can take Seroquel tid but this can cause sedation and has a black box warning for risk of death in the elderly, used off label for behavioral disturnances in PD. Patient acknowledges. Will try Clonazepam for REM sleep disorder, can cause sedation and risk oif falls. She can call with questions.  She is going to cancel appointment tomorrow, please cancel Terrence Dupont. thanks

## 2015-01-26 ENCOUNTER — Ambulatory Visit: Payer: Medicare Other | Admitting: Rehabilitative and Restorative Service Providers"

## 2015-01-26 ENCOUNTER — Ambulatory Visit: Payer: Self-pay | Admitting: Neurology

## 2015-01-28 ENCOUNTER — Encounter (HOSPITAL_BASED_OUTPATIENT_CLINIC_OR_DEPARTMENT_OTHER): Payer: Self-pay | Admitting: *Deleted

## 2015-01-28 ENCOUNTER — Emergency Department (HOSPITAL_BASED_OUTPATIENT_CLINIC_OR_DEPARTMENT_OTHER): Payer: Medicare Other

## 2015-01-28 ENCOUNTER — Emergency Department (HOSPITAL_BASED_OUTPATIENT_CLINIC_OR_DEPARTMENT_OTHER)
Admission: EM | Admit: 2015-01-28 | Discharge: 2015-01-28 | Disposition: A | Discharge status: Home / Self Care | Payer: Medicare Other | Attending: Physician Assistant | Admitting: Physician Assistant

## 2015-01-28 DIAGNOSIS — R4182 Altered mental status, unspecified: Secondary | ICD-10-CM | POA: Diagnosis not present

## 2015-01-28 DIAGNOSIS — Z79899 Other long term (current) drug therapy: Secondary | ICD-10-CM | POA: Insufficient documentation

## 2015-01-28 DIAGNOSIS — I700 Atherosclerosis of aorta: Secondary | ICD-10-CM | POA: Diagnosis not present

## 2015-01-28 DIAGNOSIS — I10 Essential (primary) hypertension: Secondary | ICD-10-CM | POA: Diagnosis not present

## 2015-01-28 DIAGNOSIS — R410 Disorientation, unspecified: Secondary | ICD-10-CM | POA: Diagnosis not present

## 2015-01-28 DIAGNOSIS — Z87438 Personal history of other diseases of male genital organs: Secondary | ICD-10-CM | POA: Insufficient documentation

## 2015-01-28 DIAGNOSIS — M1990 Unspecified osteoarthritis, unspecified site: Secondary | ICD-10-CM | POA: Insufficient documentation

## 2015-01-28 DIAGNOSIS — G20 Parkinson's disease: Secondary | ICD-10-CM | POA: Diagnosis not present

## 2015-01-28 DIAGNOSIS — Z8639 Personal history of other endocrine, nutritional and metabolic disease: Secondary | ICD-10-CM | POA: Insufficient documentation

## 2015-01-28 DIAGNOSIS — Z87891 Personal history of nicotine dependence: Secondary | ICD-10-CM | POA: Insufficient documentation

## 2015-01-28 DIAGNOSIS — Z7982 Long term (current) use of aspirin: Secondary | ICD-10-CM | POA: Insufficient documentation

## 2015-01-28 DIAGNOSIS — F0390 Unspecified dementia without behavioral disturbance: Secondary | ICD-10-CM | POA: Diagnosis not present

## 2015-01-28 LAB — URINALYSIS, ROUTINE W REFLEX MICROSCOPIC
GLUCOSE, UA: NEGATIVE mg/dL
Hgb urine dipstick: NEGATIVE
KETONES UR: 15 mg/dL — AB
Leukocytes, UA: NEGATIVE
NITRITE: NEGATIVE
PH: 6 (ref 5.0–8.0)
Protein, ur: 30 mg/dL — AB
Specific Gravity, Urine: 1.025 (ref 1.005–1.030)
UROBILINOGEN UA: 2 mg/dL — AB (ref 0.0–1.0)

## 2015-01-28 LAB — CBC WITH DIFFERENTIAL/PLATELET
BASOS ABS: 0 10*3/uL (ref 0.0–0.1)
BASOS PCT: 1 % (ref 0–1)
Eosinophils Absolute: 0.1 10*3/uL (ref 0.0–0.7)
Eosinophils Relative: 3 % (ref 0–5)
HCT: 43.8 % (ref 39.0–52.0)
Hemoglobin: 14.2 g/dL (ref 13.0–17.0)
LYMPHS PCT: 22 % (ref 12–46)
Lymphs Abs: 1.1 10*3/uL (ref 0.7–4.0)
MCH: 30.9 pg (ref 26.0–34.0)
MCHC: 32.4 g/dL (ref 30.0–36.0)
MCV: 95.2 fL (ref 78.0–100.0)
MONOS PCT: 13 % — AB (ref 3–12)
Monocytes Absolute: 0.6 10*3/uL (ref 0.1–1.0)
NEUTROS PCT: 61 % (ref 43–77)
Neutro Abs: 2.9 10*3/uL (ref 1.7–7.7)
PLATELETS: 218 10*3/uL (ref 150–400)
RBC: 4.6 MIL/uL (ref 4.22–5.81)
RDW: 13.7 % (ref 11.5–15.5)
WBC: 4.8 10*3/uL (ref 4.0–10.5)

## 2015-01-28 LAB — COMPREHENSIVE METABOLIC PANEL
ALK PHOS: 72 U/L (ref 38–126)
ALT: <5 U/L — ABNORMAL LOW (ref 17–63)
ANION GAP: 8 (ref 5–15)
AST: 14 U/L — ABNORMAL LOW (ref 15–41)
Albumin: 3.9 g/dL (ref 3.5–5.0)
BILIRUBIN TOTAL: 0.8 mg/dL (ref 0.3–1.2)
BUN: 23 mg/dL — ABNORMAL HIGH (ref 6–20)
CO2: 26 mmol/L (ref 22–32)
CREATININE: 1.78 mg/dL — AB (ref 0.61–1.24)
Calcium: 9.4 mg/dL (ref 8.9–10.3)
Chloride: 106 mmol/L (ref 101–111)
GFR, EST AFRICAN AMERICAN: 38 mL/min — AB (ref >60)
GFR, EST NON AFRICAN AMERICAN: 33 mL/min — AB (ref >60)
Glucose, Bld: 102 mg/dL — ABNORMAL HIGH (ref 65–99)
POTASSIUM: 4.4 mmol/L (ref 3.5–5.1)
Sodium: 140 mmol/L (ref 135–145)
Total Protein: 7.1 g/dL (ref 6.5–8.1)

## 2015-01-28 LAB — URINE MICROSCOPIC-ADD ON

## 2015-01-28 LAB — TROPONIN I: Troponin I: <0.03 ng/mL (ref <0.031)

## 2015-01-28 NOTE — ED Notes (Addendum)
Family brought pt to ED for evaluation of change in mental status and having very dark urine and odor noted in urine

## 2015-01-28 NOTE — Discharge Instructions (Signed)
If Mr. Chad Cooke has any other concerning symptoms please return to emergency department or your regular physician as needed. Confusion Confusion is the inability to think with your usual speed or clarity. Confusion may come on quickly or slowly over time. How quickly the confusion comes on depends on the cause. Confusion can be due to any number of causes. CAUSES   Concussion, head injury, or head trauma.  Seizures.  Stroke.  Fever.  Brain tumor.  Age related decreased brain function (dementia).  Heightened emotional states like rage or terror.  Mental illness in which the person loses the ability to determine what is real and what is not (hallucinations).  Infections such as a urinary tract infection (UTI).  Toxic effects from alcohol, drugs, or prescription medicines.  Dehydration and an imbalance of salts in the body (electrolytes).  Lack of sleep.  Low blood sugar (diabetes).  Low levels of oxygen from conditions such as chronic lung disorders.  Drug interactions or other medicine side effects.  Nutritional deficiencies, especially niacin, thiamine, vitamin C, or vitamin B.  Sudden drop in body temperature (hypothermia).  Change in routine, such as when traveling or hospitalized. SIGNS AND SYMPTOMS  People often describe their thinking as cloudy or unclear when they are confused. Confusion can also include feeling disoriented. That means you are unaware of where or who you are. You may also not know what the date or time is. If confused, you may also have difficulty paying attention, remembering, and making decisions. Some people also act aggressively when they are confused.  DIAGNOSIS  The medical evaluation of confusion may include:  Blood and urine tests.  X-rays.  Brain and nervous system tests.  Analyzing your brain waves (electroencephalogram or EEG).  Magnetic resonance imaging (MRI) of your head.  Computed tomography (CT) scan of your  head.  Mental status tests in which your health care provider may ask many questions. Some of these questions may seem silly or strange, but they are a very important test to help diagnose and treat confusion. TREATMENT  An admission to the hospital may not be needed, but a person with confusion should not be left alone. Stay with a family member or friend until the confusion clears. Avoid alcohol, pain relievers, or sedative drugs until you have fully recovered. Do not drive until directed by your health care provider. HOME CARE INSTRUCTIONS  What family and friends can do:  To find out if someone is confused, ask the person to state his or her name, age, and the date. If the person is unsure or answers incorrectly, he or she is confused.  Always introduce yourself, no matter how well the person knows you.  Often remind the person of his or her location.  Place a calendar and clock near the confused person.  Help the person with his or her medicines. You may want to use a pill box, an alarm as a reminder, or give the person each dose as prescribed.  Talk about current events and plans for the day.  Try to keep the environment calm, quiet, and peaceful.  Make sure the person keeps follow-up visits with his or her health care provider. PREVENTION  Ways to prevent confusion:  Avoid alcohol.  Eat a balanced diet.  Get enough sleep.  Take medicine only as directed by your health care provider.  Do not become isolated. Spend time with other people and make plans for your days.  Keep careful watch on your blood sugar levels  if you are diabetic. SEEK IMMEDIATE MEDICAL CARE IF:   You develop severe headaches, repeated vomiting, seizures, blackouts, or slurred speech.  There is increasing confusion, weakness, numbness, restlessness, or personality changes.  You develop a loss of balance, have marked dizziness, feel uncoordinated, or fall.  You have delusions, hallucinations, or  develop severe anxiety.  Your family members think you need to be rechecked. Document Released: 07/11/2004 Document Revised: 10/18/2013 Document Reviewed: 07/09/2013 [facility] Patient Information 2015 [place], [place]. This information is not intended to replace advice given to you by your health care provider. Make sure you discuss any questions you have with your health care provider.

## 2015-01-28 NOTE — ED Notes (Signed)
Pt's daughter reports pt with increased confusion x 2 weeks over baseline (pt has hx of dementia and parkinsons). Also states pt's urine dark and has odor x 1 week

## 2015-01-28 NOTE — ED Notes (Signed)
MD at bedside. 

## 2015-01-28 NOTE — ED Provider Notes (Addendum)
CSN: [id number]     Arrival date & time 01/28/15  1204 History   First MD Initiated Contact with Patient 01/28/15 1222     Chief Complaint  Patient presents with  . Altered Mental Status     (Consider location/radiation/quality/duration/timing/severity/associated sxs/prior Treatment) HPI   Patient is an 81 yearr old male with past medical history significant for Parkinson's disease dementia and multiple presentations for altered mental status. Patient had a week where he's been increasingly confused. No recent falls. Denies fall. patient here with daughter who he lives with. No focal symptoms except daughter states his urine has been darker than usual. Recent increase in quietapine and carbidopa.  Level V caveat dementia\  Past Medical History  Diagnosis Date  . Hypertension   . Hypercholesteremia   . Parkinson disease   . Osteoarthritis   . Enlarged prostate   . Dementia    Past Surgical History  Procedure Laterality Date  . None     Family History  Problem Relation Age of Onset  . Hypertension Mother   . Alzheimer's disease Maternal Grandmother   . Prostate cancer Brother    Social History  Substance Use Topics  . Smoking status: Former Research scientist (life sciences)  . Smokeless tobacco: Never Used  . Alcohol Use: No    Review of Systems  Unable to perform ROS: Dementia      Allergies  Review of patient's allergies indicates no known allergies.  Home Medications   Prior to Admission medications   Medication Sig Start Date End Date Taking? Authorizing Provider  acetaminophen (TYLENOL) 500 MG tablet Take 500 mg by mouth every 6 (six) hours as needed for mild pain or moderate pain.    Yes Historical Provider, MD  aspirin 81 MG tablet Take 81 mg by mouth daily.   Yes Historical Provider, MD  carbidopa-levodopa (SINEMET IR) 25-100 MG per tablet Take 1.5 tablets by mouth 4 (four) times daily. 01/25/15  Yes Melvenia Beam, MD  Carbidopa-Levodopa ER (SINEMET CR) 25-100 MG tablet  controlled release Take 1 tablet by mouth at bedtime. 11/22/14  Yes Melvenia Beam, MD  donepezil (ARICEPT) 10 MG tablet TAKE 1 TABLET (10 MG TOTAL) BY MOUTH AT BEDTIME. 09/12/14  Yes Melvenia Beam, MD  finasteride (PROSCAR) 5 MG tablet Take 5 mg by mouth daily.   Yes Historical Provider, MD  ibuprofen (ADVIL,MOTRIN) 200 MG tablet Take 200 mg by mouth every 6 (six) hours as needed for headache, mild pain or moderate pain.   Yes Historical Provider, MD  Multiple Vitamins-Minerals (MULTIVITAMIN WITH MINERALS) tablet Take 0.5 tablets by mouth daily.   Yes Historical Provider, MD  QUEtiapine (SEROQUEL) 25 MG tablet Take 1 tablet (25 mg total) by mouth 3 (three) times daily. 01/25/15  Yes Melvenia Beam, MD  tamsulosin (FLOMAX) 0.4 MG CAPS capsule Take 0.4 mg by mouth daily.    Yes Historical Provider, MD  clonazePAM (KLONOPIN) 0.25 MG disintegrating tablet Take 1 tablet (0.25 mg total) by mouth at bedtime. 01/25/15   Melvenia Beam, MD   BP 173/88 mmHg  Pulse 73  Temp(Src) 98.2 F (36.8 C) (Oral)  Resp 20  Ht 6\' 2"  (1.88 m)  Wt 180 lb (81.647 kg)  BMI 23.10 kg/m2  SpO2 100% Physical Exam  Constitutional: He appears well-nourished.  HENT:  Head: Normocephalic.  Mouth/Throat: Oropharynx is clear and moist.  Eyes: Conjunctivae are normal.  Neck: No tracheal deviation present.  Cardiovascular: Normal rate and regular rhythm.   No murmur heard.  Pulmonary/Chest: Effort normal. No stridor. No respiratory distress.  Abdominal: Soft. There is no tenderness. There is no guarding.  Musculoskeletal: Normal range of motion. He exhibits no edema.  Neurological: No cranial nerve deficit.  Skin: Skin is warm and dry. He is not diaphoretic.  Shiny taut skin  Nursing note and vitals reviewed.   ED Course  Procedures (including critical care time) Labs Review Labs Reviewed  URINE CULTURE  COMPREHENSIVE METABOLIC PANEL  CBC WITH DIFFERENTIAL/PLATELET  URINALYSIS, ROUTINE W REFLEX MICROSCOPIC (NOT  AT [facility])  TROPONIN I    Imaging Review No results found. I, [place], personally reviewed and evaluated these images and lab results as part of my medical decision-making.   EKG Interpretation None      MDM   Final diagnoses:  None    Patient is an 81 year old gentleman presenting today with altered mental status. Patient is on multiple medications for his dementia and Parkinson's. Recent increase in quietapine and carbidopa. Patient's daughter is there is a mildly darker urine than usual. Otherwise daughter states he may have been slightly off his baseline, vaguely. Concern for UTI versus pneumonia. Patient has no focal deficits neurologically. \ Will do CBC Chem-7 urine chest x-ray and EKG.  Vital signs and physical exam are baseline.  Marland Kitchen2:45 PM Patient's labs are within normal limits. He appears at baseline per daughter. Daughter is willing and able to take him back home.  Anja Neuzil Julio Alm, MD 01/28/15 [place], MD 01/28/15 1445

## 2015-01-28 NOTE — ED Notes (Signed)
High Fall Risk protocol implemented, sr x 2 up, family at bedside, fall risk bracelet applied, family informed of fall risk action plan

## 2015-01-28 NOTE — ED Notes (Signed)
Patient transported to X-ray via stretcher, sr x 2 up 

## 2015-01-30 LAB — URINE CULTURE: Culture: NO GROWTH

## 2015-02-06 ENCOUNTER — Other Ambulatory Visit: Payer: Self-pay | Admitting: Neurology

## 2015-02-27 DIAGNOSIS — F0390 Unspecified dementia without behavioral disturbance: Secondary | ICD-10-CM | POA: Diagnosis not present

## 2015-02-27 DIAGNOSIS — R609 Edema, unspecified: Secondary | ICD-10-CM | POA: Diagnosis not present

## 2015-02-27 DIAGNOSIS — N183 Chronic kidney disease, stage 3 (moderate): Secondary | ICD-10-CM | POA: Diagnosis not present

## 2015-02-27 DIAGNOSIS — Z23 Encounter for immunization: Secondary | ICD-10-CM | POA: Diagnosis not present

## 2015-02-27 DIAGNOSIS — G20 Parkinson's disease: Secondary | ICD-10-CM | POA: Diagnosis not present

## 2015-02-27 DIAGNOSIS — N400 Enlarged prostate without lower urinary tract symptoms: Secondary | ICD-10-CM | POA: Diagnosis not present

## 2015-02-27 DIAGNOSIS — I10 Essential (primary) hypertension: Secondary | ICD-10-CM | POA: Diagnosis not present

## 2015-06-09 ENCOUNTER — Other Ambulatory Visit: Payer: Self-pay | Admitting: Neurology

## 2015-07-04 ENCOUNTER — Telehealth: Payer: Self-pay | Admitting: Neurology

## 2015-07-04 MED ORDER — CARBIDOPA-LEVODOPA ER 25-100 MG PO TBCR: 1.0000 | EXTENDED_RELEASE_TABLET | Freq: Every day | ORAL | Status: DC

## 2015-07-04 MED ORDER — CARBIDOPA-LEVODOPA 25-100 MG PO TABS: 1.5000 | ORAL_TABLET | Freq: Four times a day (QID) | ORAL | Status: DC

## 2015-07-04 MED ORDER — CARBIDOPA-LEVODOPA 25-100 MG PO TABS
1.5000 | ORAL_TABLET | Freq: Four times a day (QID) | ORAL | Status: DC
Start: 2015-07-04 — End: 2015-07-04

## 2015-07-04 MED ORDER — QUETIAPINE FUMARATE 25 MG PO TABS
25.0000 mg | ORAL_TABLET | Freq: Three times a day (TID) | ORAL | Status: DC
Start: 2015-07-04 — End: 2015-07-04

## 2015-07-04 MED ORDER — DONEPEZIL HCL 10 MG PO TABS: 10.0000 mg | ORAL_TABLET | Freq: Every day | ORAL | Status: DC

## 2015-07-04 MED ORDER — QUETIAPINE FUMARATE 25 MG PO TABS: 25.0000 mg | ORAL_TABLET | Freq: Three times a day (TID) | ORAL | Status: DC

## 2015-07-04 NOTE — Telephone Encounter (Signed)
Yes refill for 6 months and tell him he needs to follow up within that time or I can't refill again thanks

## 2015-07-04 NOTE — Telephone Encounter (Signed)
Chad Cooke called to advise that with change of insurance, Pharmacy has changed to [facility], patient needs refills of carbidopa-levodopa (SINEMET IR) 25-100 MG per tablet, Carbidopa-Levodopa ER (SINEMET CR) 25-100 MG tablet controlled release, QUEtiapine (SEROQUEL) 25 MG tablet and donepezil (ARICEPT) 10 MG tablet. Fax to [phone] Order# [id number], Phone# [phone].

## 2015-07-04 NOTE — Telephone Encounter (Signed)
Faxed requested prescriptions to fax [phone]. Daughter had to get father new insurance because her mother passed away recently. Offered my condolences. Told her to call if she has any further questions. She verbalized understanding. Scheduled f/u for 12/04/15 at 11am to receive further refills.

## 2015-07-04 NOTE — Telephone Encounter (Signed)
[facility]ed [facility] to cx aricept rx sent by error. Spoke to [employer] . They cx rx aricept. Printed rx instead.

## 2015-07-05 NOTE — Telephone Encounter (Signed)
It appears Chad Cooke has already taken care of this.  Please see note below.  Thank you.

## 2015-07-05 NOTE — Telephone Encounter (Signed)
Felicia called said she made pt appt for 12/04/15. She said QUEtiapine (SEROQUEL) 25 MG tablet will carry him for 18mth , pt take this medication 3 x day. He will need more refills please.

## 2015-08-17 ENCOUNTER — Telehealth: Payer: Self-pay | Admitting: Neurology

## 2015-08-17 DIAGNOSIS — G20 Parkinson's disease: Secondary | ICD-10-CM

## 2015-08-17 NOTE — Telephone Encounter (Signed)
Called daughter back. She would like to have referral placed to [facility] for PT. This is where he went previously. She is not sure if physical therapy would help with sx. He is having more difficulty ambulating and moving his legs. Advised I will placed referral. They will call to set up appt with them. She also would like an order placed for a hospital bed. Advised I will ask Dr Jaynee Eagles, and if ok, I will place that order also. She states his bed is very low and this would help him to get in and out of bed. Told her I will call back to update her about this. She verbalized understanding.

## 2015-08-17 NOTE — Telephone Encounter (Signed)
Pt's daughter called and is asking for a home PT referral. She says that he is having difficulty walking. She would like a call from Dr. Jaynee Eagles to discuss as well. Please call and advise [phone]

## 2015-08-17 NOTE — Telephone Encounter (Signed)
Faxed signed order for hospital bed to [facility]. Fax: [phone]. Received confirmation.

## 2015-08-21 ENCOUNTER — Telehealth: Payer: Self-pay | Admitting: Neurology

## 2015-08-21 NOTE — Telephone Encounter (Signed)
Dawn with [facility] @[phone] ext. [phone] is calling in regard to a referral for the patient. Please call.

## 2015-08-22 NOTE — Telephone Encounter (Signed)
Called Dawn with [facility] back and left her a message.

## 2015-08-22 NOTE — Telephone Encounter (Signed)
Called daughter back. Advised pt will have to come in for appointment for Dr Jaynee Eagles to assess need for hospital bed. Insurance will need documentation to help cover this. Right now, there are no notes to document this. Offered to make earlier appointment than June. She is going to think about it and call back. Also advised that we are still working on referral for PT through [facility]. She verbalized understanding.

## 2015-08-24 NOTE — Telephone Encounter (Signed)
[name] home health care) Nurse, learning disability stopped in office yesterday to advise they are short staffed. Pt care would not be able to start until next week. Advised I will call daughter and inform her.   Called daughter, [name] and advised that [facility] would not be able to start care until next week. She is okay w/ this. Also made appt for 3/21 at 830am for them to come an discuss need for hospital bed. [facility] asking for documentation to support this need.   Called and spoke to [place] at [place]. She is going to contact [facility] to let them know start date for PT is Monday, and took verbal order for delayed start. She will contact daughter to let her know.

## 2015-09-05 ENCOUNTER — Ambulatory Visit: Payer: Self-pay | Admitting: Neurology

## 2015-09-08 ENCOUNTER — Ambulatory Visit (INDEPENDENT_AMBULATORY_CARE_PROVIDER_SITE_OTHER): Payer: Medicare Other | Admitting: Neurology

## 2015-09-08 ENCOUNTER — Encounter: Payer: Self-pay | Admitting: Neurology

## 2015-09-08 VITALS — BP 143/87 | HR 66 | Temp 97.0°F | Ht 74.0 in

## 2015-09-08 DIAGNOSIS — G20 Parkinson's disease: Secondary | ICD-10-CM | POA: Diagnosis not present

## 2015-09-08 DIAGNOSIS — K117 Disturbances of salivary secretion: Secondary | ICD-10-CM

## 2015-09-08 DIAGNOSIS — F0280 Dementia in other diseases classified elsewhere without behavioral disturbance: Secondary | ICD-10-CM | POA: Diagnosis not present

## 2015-09-08 DIAGNOSIS — G20A1 Parkinson's disease without dyskinesia, without mention of fluctuations: Secondary | ICD-10-CM

## 2015-09-08 DIAGNOSIS — Z7409 Other reduced mobility: Secondary | ICD-10-CM | POA: Diagnosis not present

## 2015-09-08 DIAGNOSIS — R6889 Other general symptoms and signs: Secondary | ICD-10-CM

## 2015-09-08 MED ORDER — DONEPEZIL HCL 10 MG PO TABS: 10.0000 mg | ORAL_TABLET | Freq: Every day | ORAL | Status: DC

## 2015-09-08 MED ORDER — MEMANTINE HCL-DONEPEZIL HCL 7 & 14 & 21 &28 -10 MG PO C4PK: 1.0000 | EXTENDED_RELEASE_CAPSULE | Freq: Every day | ORAL | Status: DC

## 2015-09-08 MED ORDER — CARBIDOPA-LEVODOPA 25-100 MG PO TABS: 1.5000 | ORAL_TABLET | Freq: Four times a day (QID) | ORAL | Status: DC

## 2015-09-08 MED ORDER — CARBIDOPA-LEVODOPA ER 25-100 MG PO TBCR: 1.0000 | EXTENDED_RELEASE_TABLET | Freq: Every day | ORAL | Status: DC

## 2015-09-08 MED ORDER — QUETIAPINE FUMARATE 25 MG PO TABS: 25.0000 mg | ORAL_TABLET | Freq: Three times a day (TID) | ORAL | Status: DC

## 2015-09-08 NOTE — Patient Instructions (Signed)
Overall you are doing fairly well but I do want to suggest a few things today:   Remember to drink plenty of fluid, eat healthy meals and do not skip any meals. Try to eat protein with a every meal and eat a healthy snack such as fruit or nuts in between meals. Try to keep a regular sleep-wake schedule and try to exercise daily, particularly in the form of walking, 20-30 minutes a day, if you can.   As far as your medications are concerned, I would like to suggest: Stop Aricept. Start namzeric which includes namenda and Aricept (donepezil) If works ok, can transition to [employer]. Unless Namzeric is too expensive then can take Donepezil and Namenda separately which is often less expensive.   As far as diagnostic testing: labs  I would like to see you back in 6 months, sooner if we need to. Please call us with any interim questions, concerns, problems, updates or refill requests.   Our phone number is [phone]. We also have an after hours call service for urgent matters and there is a physician on-call for urgent questions. For any emergencies you know to call 911 or go to the nearest emergency room

## 2015-09-08 NOTE — Progress Notes (Signed)
[name]    Provider:  Dr Jaynee Cooke Referring Provider: Antony Contras, MD Primary Care Physician:  Chad Kroner, MD  CC: Parkinson's Disease  Interval update 09/08/2015: patient was diagnosed with parkinson's disease by Chad Cooke, movement disorder specialist, in the past. He was diagnosed with PD by a previous neurologist and he has been on Sinemet for many years. He has been on Sinemet which helps his tremor of the right hand. When the Sinemet is wearing off he gets a tremor int he right hand. He was diagnosed after right hand tremor and shuffling of the feet.  No difficulty swallowing but needs help with feeding. Patient is here for follow up. He has difficulty getting up out of seats and can't get up unassisted. Can't transfer independently. He cannot easily be transferred from the bed to his wheelchair and he needs much assistance standing. He cannot adjust his own bed sheets and having difficulty changing positions while in bed.  He can take steps on his own after he gets up. He is mostly in a wheelchair now. He can use a walker for short periods at home. But he cannot stand up unassisted or transfer unassisted or get up out of bed unassisted. He has more difficulty with low seats. He is is in physical therapy and not really helping. Memory is very poor. He does know people in the house but worsening. More short-term difficulty.  voice is hypophonic, his voice is getting lower. He acts out dreams and has vivid dreams. He has drooling and wet pillows. He cannot walk very far at all. His walking is getting much slower. Memory worsening. He is in a wheelchair. He can use a walker with assistance with a seat on it if he gets tired and needs to sit. No falls recently. He has urinary incontinence and wears depends. No depression, he gets mean when he gets frustrated. He is having hallucinations, will see children and things outside. He is on the Aricept, could not afford the Exelon.  They want to stay on the Aricept. No side effects to the carbidopa/levodopa, no dyskinesias. Daughter reports fluctuating alertness.    HPI: Chad Cooke is a 82 y.o. male here as a referral from Chad Cooke for PD. He is a former patient of Chad Cooke. He had a fall since last being seen. He is here with his daughter. He "got of the house" and fell down the stairs. He was confused that day, they turned their back for a moment and he was out of the house. He is back to baseline. They have an appointment with neurorehab for an assesment for a wheelchair. He is still shuffling. They can tell when he doesn't take the medication. The medication seems to last the whole 4 hours without significant wearing off. No dyskinesias. No dizziness or side effects from the medication. He is having poor balance. He is very stiff in the mornings.     Interval update 11/22/2014:  Interval update 05/23/2014: The Exelon patch was expensive so they changed back to Aricept. No falls. No side effects fro the medication. He has episodes where he is "out there" seeing people that are not there but then that resolves. No falls. Still shuffling, still with bradycardia. He has been on tid for 8 months, 1.5 tabs. Doesn't notice when it kicks in. He does walk better. Has been on the current dose for at least 6 months. No side effects like dizziness, low blood pressure. Melatonin has worked but he  hasn't needed it in a while. Sometimes he is agitated at night and melatonin helps. No Dyskinesias. He is on the seroquel twice daily and tolerating it fine. Family notes some tremors in between doses. Still hypophonia. No swallowing problems. No choking. Separating the Sinemet from protein. He complains of his legs and knees giving out. No excess saliva. Family is here with him and provides all of the information.   visit 11/12/2013: Chad Cooke is a 82 y.o. male here as a follow from Chad Cooke for transfer of care in PD. Last visit was  06/2013 at which time her Sinemet was changed to IR 4x a day and she was changed to Exelon 9.5mg . Since last visit he thinks he has been doing good and bad. He is having trouble staying asleep at night, gets up frequently overnight. He is currently taking melatonin 3mg  nightly. He will talk in his sleep and move around frequently. He notes his walking is slow, no falls, uses his walker most of the time. He continues on Exelon 9.5mg  patch, they notice a difference in his memory but no improvement in his walking. They do not feel his walking difficulties are tied to his medication wearing.   Continues to take SInemet at 8am, noon, 4pm and 9pm, takes 1.5 tabs at each dose. Daughter does notice a change if he gets his medication late, but otherwise does not notice any wearing off. No dyskinesias noted. Will have some mild visual hallucinations. Continues on Seroquel 25mg  twice a day.    Initial visit 02/2013 with Chad Cooke: Diagnosed with Parkinson's around 10 years ago. Initial symptoms were shuffling of gait in slow quiet speech. Per his daughter he is a slowly progressing since then. Main concern today is walking, feels full unsteady on his feet. No recent falls, though has had falls in the past. Walking fluctuates throughout the day. Currently taking Sinemet CR 50/203 times a day. Also on Aricept 10 mg daily. He is taking the Sinemet at 8:30 AM, 12:30 and 9 PM. Notes motor fluctuations throughout the day. Notes that Aricept makes him feel very groggy. Notes stiffness and cramping of his muscles, rest tremor worse in left hand. No difficulty with swallowing, no choking. Does note some excessive saliva production but not bothersome at this time. 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   Reviewed notes, labs and imaging from outside physicians, which showed: CBC unremarkable, CMP with BUN/Creat 28/1.57.    Review of Systems: Patient complains of symptoms per HPI as  well as the following symptoms: No CP, No SOB. Pertinent negatives per HPI. All others negative.   Social History   Social History  . Marital Status: Married    Spouse Name: Barbaraann Share  . Number of Children: 1  . Years of Education: Bachelor   Occupational History  . Retired    Social History Main Topics  . Smoking status: Former Research scientist (life sciences)  . Smokeless tobacco: Never Used  . Alcohol Use: No  . Drug Use: No  . Sexual Activity: Not on file   Other Topics Concern  . Not on file   Social History Narrative   Patient is married, Barbaraann Share), has 1child   Patient is retired.    Patient is right handed   Education level is Bachelor's degree    Caffeine consumption is 0          Family History  Problem Relation Age of Onset  . Hypertension Mother   .  Alzheimer's disease Maternal Grandmother   . Prostate cancer Brother     Past Medical History  Diagnosis Date  . Hypertension   . Hypercholesteremia   . Parkinson disease ([place])   . Osteoarthritis   . Enlarged prostate   . Dementia     Past Surgical History  Procedure Laterality Date  . None      Current Outpatient Prescriptions  Medication Sig Dispense Refill  . acetaminophen (TYLENOL) 500 MG tablet Take 500 mg by mouth every 6 (six) hours as needed for mild pain or moderate pain.     Marland Kitchen aspirin 81 MG tablet Take 81 mg by mouth daily.    . carbidopa-levodopa (SINEMET IR) 25-100 MG tablet Take 1.5 tablets by mouth 4 (four) times daily. 540 tablet 5  . Carbidopa-Levodopa ER (SINEMET CR) 25-100 MG tablet controlled release Take 1 tablet by mouth at bedtime. 90 tablet 5  . finasteride (PROSCAR) 5 MG tablet Take 5 mg by mouth daily.    . Multiple Vitamins-Minerals (MULTIVITAMIN WITH MINERALS) tablet Take 0.5 tablets by mouth daily.    . QUEtiapine (SEROQUEL) 25 MG tablet Take 1 tablet (25 mg total) by mouth 3 (three) times daily. 270 tablet 5  . tamsulosin (FLOMAX) 0.4 MG CAPS capsule Take 0.4 mg by mouth daily.     . Memantine  HCl-Donepezil HCl (NAMZARIC) 7 & 14 & 21 &28 -10 MG C4PK Take 1 tablet by mouth at bedtime. 28 each 0   No current facility-administered medications for this visit.    Allergies as of 09/08/2015  . (No Known Allergies)    Vitals: BP 143/87 mmHg  Pulse 66  Temp(Src) 97 F (36.1 C) (Oral)  Ht 6\' 2"  (1.88 m) Last Weight:  Wt Readings from Last 1 Encounters:  01/28/15 180 lb (81.647 kg)   Last Height:   Ht Readings from Last 1 Encounters:  09/08/15 6\' 2"  (1.88 m)     Neuro: MS: pleasant, alert, minimally interactive  Speech: fluent w/o paraphasic error, hypophonic  Memory: impaired recent and remote recall. He says it is January, doesn't know the month. Doesn't know the year. He knows his birthdate.  Difficulty following commands such as when asked to do finger taps.   CN: PERRL, Impaired upgaze otherwise EOMI no nystagmus, no ptosis, sensation intact to LT V1-V3 bilat, face symmetric, no weakness, hearing grossly intact, palate elevates symmetrically, shoulder shrug 5/5 bilat,  tongue protrudes midline, no fasiculations noted.  Motor:Difficulty following comands to do a full motor exam, he has bilateral hip flexion weakness but otherwise appears intact. Upper extremity mild cogwheeling  Resting and postural tremor seen on exam in the past not noticed today. He took his Sinemet at 7:30am.  Coordination: Bradykinesia finger tap  Reflexes: symmetrical, bilat downgoing toes  Sens: LT intact in all extremities  Gait: cannot get out of wheelchair unassisted, few steps shuffling, decreased arm swing, slow, en-bloc turning  Speech: hypophonic, no dysarthria or aphasia   Facial Expression:masked facies    Assessment/Plan:Mr Chad Cooke is a pleasant 82y/o male with Parkinson's disease, REM sleep disorder, dementia, Sialorrhea and increased secretions, and continued worsening ability to transfer or stand independently. Presenting for follow up visit. Main concern at visit  today is his worsened ability to stand or transfer independently and significantly decreased mobility with the inability to adjust his own bed sheets or adjust his position during the night. Patient cannot stand independently today and he needs considerable help to stand from his wheelchair. I discussed with daughter  today that patient needs a semi-electric hospital bed. This will allow him to more easily have frequent changes in body position to prevent skin breakdown and to help prevent contractures, and to allow easier transfer from bed to wheelchair.  Also due to his increased secretions which is very common in Parkinson's disease , he should be able to raise the head of the bed which will help prevent aspiration of secretions. The patient may still has the capability to operate the controls for himself. This will also be easier for caretakers to manage him.  Continue with in-home physical therapy Continue long acting Carbidopa Levodopa at night. Continue Carbidopa/Levodopa 25-100 every three hours in the day 1.5 tabs then long-acting an hour before bed We'll start Namenda. Discussed side effects with daughter and asked her to stop for anything concerning especially a rash. Watch for dizziness, somnolence, worsening gait, worsening mental status, diarrhea or any GI side effects, and others. Watch for falls, patient is a fall risk.  Sarina Ill, MD  [facility] [address] [place], [place] [address]  Phone [phone] Fax [phone]  A total of 30 minutes was spent face-to-face with this patient. Over half this time was spent on counseling patient on the parkinson's disease diagnosis and different diagnostic and therapeutic options available.

## 2015-09-09 LAB — THYROID PANEL WITH TSH
Free Thyroxine Index: 2.2 (ref 1.2–4.9)
T3 Uptake Ratio: 29 % (ref 24–39)
T4 TOTAL: 7.5 ug/dL (ref 4.5–12.0)
TSH: 0.483 u[IU]/mL (ref 0.450–4.500)

## 2015-09-09 LAB — HIV ANTIBODY (ROUTINE TESTING W REFLEX): HIV SCREEN 4TH GENERATION: NONREACTIVE

## 2015-09-09 LAB — RPR: RPR Ser Ql: NONREACTIVE

## 2015-09-09 LAB — B12 AND FOLATE PANEL
Folate: 8.1 ng/mL (ref >3.0)
VITAMIN B 12: 605 pg/mL (ref 211–946)

## 2015-09-11 ENCOUNTER — Telehealth: Payer: Self-pay | Admitting: *Deleted

## 2015-09-11 NOTE — Telephone Encounter (Signed)
-----   Message from Melvenia Beam, MD sent at 09/09/2015 10:25 AM EDT ----- Labs normal thanks

## 2015-09-11 NOTE — Telephone Encounter (Signed)
Called and spoke to daughter about normal labs. She verbalized understanding.

## 2015-09-14 ENCOUNTER — Encounter: Payer: Self-pay | Admitting: *Deleted

## 2015-09-14 NOTE — Progress Notes (Signed)
Faxed order back to [facility] for semi-electric hospital bed with supporting documentation from Dr Jaynee Eagles. Fax: [phone]. Received confirmation. Sent copy to MR.

## 2015-09-25 ENCOUNTER — Encounter: Payer: Self-pay | Admitting: Neurology

## 2015-09-25 ENCOUNTER — Other Ambulatory Visit: Payer: Self-pay | Admitting: Neurology

## 2015-09-25 MED ORDER — MEMANTINE HCL-DONEPEZIL HCL ER 28-10 MG PO CP24: 1.0000 | ORAL_CAPSULE | Freq: Every day | ORAL | Status: DC

## 2015-09-26 ENCOUNTER — Other Ambulatory Visit: Payer: Self-pay | Admitting: Neurology

## 2015-09-26 MED ORDER — MEMANTINE HCL 10 MG PO TABS: 10.0000 mg | ORAL_TABLET | Freq: Two times a day (BID) | ORAL | Status: DC

## 2015-09-26 MED ORDER — DONEPEZIL HCL 10 MG PO TABS: 10.0000 mg | ORAL_TABLET | Freq: Every day | ORAL | Status: DC

## 2015-10-04 ENCOUNTER — Encounter: Payer: Self-pay | Admitting: *Deleted

## 2015-10-04 NOTE — Progress Notes (Signed)
Faxed back signed POC dated 08/28/15-10/26/15. FaxNZ:[phone]. Received confirmation.

## 2015-10-19 ENCOUNTER — Encounter: Payer: Self-pay | Admitting: *Deleted

## 2015-10-19 NOTE — Progress Notes (Signed)
Faxed back completed/signed POC to [facility]. Start of Care date: 08/28/15. Certification period: 08/28/15-10/26/15. Fax:[phone]. Received confirmation.

## 2015-12-04 ENCOUNTER — Ambulatory Visit: Payer: Self-pay | Admitting: Neurology

## 2016-03-11 ENCOUNTER — Ambulatory Visit: Payer: Self-pay | Admitting: Neurology

## 2016-09-27 ENCOUNTER — Other Ambulatory Visit: Payer: Self-pay | Admitting: Neurology

## 2016-11-05 ENCOUNTER — Ambulatory Visit (INDEPENDENT_AMBULATORY_CARE_PROVIDER_SITE_OTHER): Payer: Medicare Other | Admitting: Neurology

## 2016-11-05 ENCOUNTER — Encounter: Payer: Self-pay | Admitting: Neurology

## 2016-11-05 VITALS — BP 106/71 | HR 72 | Ht 73.0 in | Wt 145.0 lb

## 2016-11-05 DIAGNOSIS — G20 Parkinson's disease: Secondary | ICD-10-CM

## 2016-11-05 DIAGNOSIS — F0281 Dementia in other diseases classified elsewhere with behavioral disturbance: Secondary | ICD-10-CM | POA: Diagnosis not present

## 2016-11-05 DIAGNOSIS — F02818 Dementia in other diseases classified elsewhere, unspecified severity, with other behavioral disturbance: Secondary | ICD-10-CM

## 2016-11-05 MED ORDER — DONEPEZIL HCL 10 MG PO TABS: 10.0000 mg | ORAL_TABLET | Freq: Every day | ORAL | 6 refills | Status: DC

## 2016-11-05 MED ORDER — QUETIAPINE FUMARATE 25 MG PO TABS: 25.0000 mg | ORAL_TABLET | Freq: Three times a day (TID) | ORAL | 6 refills | Status: DC

## 2016-11-05 MED ORDER — CARBIDOPA-LEVODOPA 25-100 MG PO TABS: ORAL_TABLET | ORAL | 5 refills | Status: DC

## 2016-11-05 MED ORDER — MEMANTINE HCL 10 MG PO TABS: ORAL_TABLET | ORAL | 6 refills | Status: DC

## 2016-11-05 MED ORDER — CARBIDOPA-LEVODOPA ER 25-100 MG PO TBCR: 1.0000 | EXTENDED_RELEASE_TABLET | Freq: Every day | ORAL | 11 refills | Status: DC

## 2016-11-05 NOTE — Progress Notes (Signed)
[name]    Provider:  Dr Jaynee Eagles Referring Provider: Antony Contras, MD Primary Care Physician:  Antony Contras, MD  CC: Parkinson's Disease  Interval update 11/05/2016: Patient is here for follow-up with Parkinson's disease and Parkinson's disease dementia on Sinemet, Aricept and Namenda.He is doing well. Daughter and caregiver give all information. He has episodes where he "wigs out" he has eating and he stops for example and he freezes and he responds but sometimes unresponsive like he fell asleep. It can last about 30 minutes. Not responsive at all. No shaking just calm. He is sometimes confused and tired. No shaking or eye rolling. Having some freezing episodes.  He is swallowing fine. No choking or coughing. Drooling is not worsened. He has an electric bed.   Interval update 09/08/2015: patient was diagnosed with parkinson's disease by Dr. Janann Colonel, movement disorder specialist, in the past. He was diagnosed with PD by a previous neurologist and he has been on Sinemet for many years. He has been on Sinemet which helps his tremor of the right hand. When the Sinemet is wearing off he gets a tremor int he right hand. He was diagnosed after right hand tremor and shuffling of the feet.  No difficulty swallowing but needs help with feeding. Patient is here for follow up. He has difficulty getting up out of seats and can't get up unassisted. Can't transfer independently. He cannot easily be transferred from the bed to his wheelchair and he needs much assistance standing. He cannot adjust his own bed sheets and having difficulty changing positions while in bed.  He can take steps on his own after he gets up. He is mostly in a wheelchair now. He can use a walker for short periods at home. But he cannot stand up unassisted or transfer unassisted or get up out of bed unassisted. He has more difficulty with low seats. He is is in physical therapy and not really helping. Memory is very  poor. He does know people in the house but worsening. More short-term difficulty.  voice is hypophonic, his voice is getting lower. He acts out dreams and has vivid dreams. He has drooling and wet pillows. He cannot walk very far at all. His walking is getting much slower. Memory worsening. He is in a wheelchair. He can use a walker with assistance with a seat on it if he gets tired and needs to sit. No falls recently. He has urinary incontinence and wears depends. No depression, he gets mean when he gets frustrated. He is having hallucinations, will see children and things outside. He is on the Aricept, could not afford the Exelon. They want to stay on the Aricept. No side effects to the carbidopa/levodopa, no dyskinesias. Daughter reports fluctuating alertness.    HPI: Jake Fuhrmann is a 83 y.o. male here as a referral from Dr. Moreen Fowler for PD. He is a former patient of Dr. Janann Colonel. He had a fall since last being seen. He is here with his daughter. He "got of the house" and fell down the stairs. He was confused that day, they turned their back for a moment and he was out of the house. He is back to baseline. They have an appointment with neurorehab for an assesment for a wheelchair. He is still shuffling. They can tell when he doesn't take the medication. The medication seems to last the whole 4 hours without significant wearing off. No dyskinesias. No dizziness or side effects from the medication. He is having poor balance.  He is very stiff in the mornings.     Interval update 11/22/2014:  Interval update 05/23/2014: The Exelon patch was expensive so they changed back to Aricept. No falls. No side effects fro the medication. He has episodes where he is "out there" seeing people that are not there but then that resolves. No falls. Still shuffling, still with bradycardia. He has been on tid for 8 months, 1.5 tabs. Doesn't notice when it kicks in. He does walk better. Has been on the current dose for at  least 6 months. No side effects like dizziness, low blood pressure. Melatonin has worked but he hasn't needed it in a while. Sometimes he is agitated at night and melatonin helps. No Dyskinesias. He is on the seroquel twice daily and tolerating it fine. Family notes some tremors in between doses. Still hypophonia. No swallowing problems. No choking. Separating the Sinemet from protein. He complains of his legs and knees giving out. No excess saliva. Family is here with him and provides all of the information.   visit 11/12/2013: Andree Golphin is a 83 y.o. male here as a follow from Dr. Moreen Fowler for transfer of care in PD. Last visit was 06/2013 at which time her Sinemet was changed to IR 4x a day and she was changed to Exelon 9.5mg . Since last visit he thinks he has been doing good and bad. He is having trouble staying asleep at night, gets up frequently overnight. He is currently taking melatonin 3mg  nightly. He will talk in his sleep and move around frequently. He notes his walking is slow, no falls, uses his walker most of the time. He continues on Exelon 9.5mg  patch, they notice a difference in his memory but no improvement in his walking. They do not feel his walking difficulties are tied to his medication wearing.   Continues to take SInemet at 8am, noon, 4pm and 9pm, takes 1.5 tabs at each dose. Daughter does notice a change if he gets his medication late, but otherwise does not notice any wearing off. No dyskinesias noted. Will have some mild visual hallucinations. Continues on Seroquel 25mg  twice a day.    Initial visit 02/2013 with Dr. Janann Colonel: Diagnosed with Parkinson's around 10 years ago. Initial symptoms were shuffling of gait in slow quiet speech. Per his daughter he is a slowly progressing since then. Main concern today is walking, feels full unsteady on his feet. No recent falls, though has had falls in the past. Walking fluctuates throughout the day. Currently taking Sinemet CR 50/203 times a  day. Also on Aricept 10 mg daily. He is taking the Sinemet at 8:30 AM, 12:30 and 9 PM. Notes motor fluctuations throughout the day. Notes that Aricept makes him feel very groggy. Notes stiffness and cramping of his muscles, rest tremor worse in left hand. No difficulty with swallowing, no choking. Does note some excessive saliva production but not bothersome at this time. 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   Reviewed notes, labs and imaging from outside physicians, which showed: CBC unremarkable, CMP with BUN/Creat 28/1.57.    Review of Systems: Patient complains of symptoms per HPI as well as the following symptoms: No CP, No SOB. Pertinent negatives per HPI. All others negative.  Social History   Social History  . Marital status: Married    Spouse name: Barbaraann Share  . Number of children: 1  . Years of education: Bachelor   Occupational History  . Retired  Social History Main Topics  . Smoking status: Former Research scientist (life sciences)  . Smokeless tobacco: Never Used  . Alcohol use No  . Drug use: No  . Sexual activity: Not on file   Other Topics Concern  . Not on file   Social History Narrative   Patient is married, Barbaraann Share), has 1child   Patient is retired.    Patient is right handed   Education level is Bachelor's degree    Caffeine consumption is 0          Family History  Problem Relation Age of Onset  . Hypertension Mother   . Alzheimer's disease Maternal Grandmother   . Prostate cancer Brother     Past Medical History:  Diagnosis Date  . Dementia   . Enlarged prostate   . Hypercholesteremia   . Hypertension   . Osteoarthritis   . Parkinson disease [facility])     Past Surgical History:  Procedure Laterality Date  . None      Current Outpatient Prescriptions  Medication Sig Dispense Refill  . acetaminophen (TYLENOL) 500 MG tablet Take 500 mg by mouth every 6 (six) hours as needed for mild pain or moderate pain.     Marland Kitchen aspirin  81 MG tablet Take 81 mg by mouth daily.    . carbidopa-levodopa (SINEMET IR) 25-100 MG tablet TAKE 1.5 TABLETS BY MOUTH 4 TIMES DAILY 540 tablet 0  . Carbidopa-Levodopa ER (SINEMET CR) 25-100 MG tablet controlled release TAKE 1 TABLET BY MOUTH AT  BEDTIME 90 tablet 0  . donepezil (ARICEPT) 10 MG tablet TAKE 1 TABLET BY MOUTH AT  BEDTIME 90 tablet 0  . finasteride (PROSCAR) 5 MG tablet Take 5 mg by mouth daily.    . memantine (NAMENDA) 10 MG tablet TAKE 1 TABLET BY MOUTH TWO  TIMES DAILY EQUIVALENT TO NAMENDA 180 tablet 0  . Multiple Vitamins-Minerals (MULTIVITAMIN WITH MINERALS) tablet Take 0.5 tablets by mouth daily.    . QUEtiapine (SEROQUEL) 25 MG tablet TAKE 1 TABLET BY MOUTH 3  TIMES DAILY 270 tablet 0  . tamsulosin (FLOMAX) 0.4 MG CAPS capsule Take 0.4 mg by mouth daily.      No current facility-administered medications for this visit.     Allergies as of 11/05/2016  . (No Known Allergies)    Vitals: BP 106/71   Pulse 72   Ht 6\' 1"  (1.854 m)   Wt 145 lb (65.8 kg)   BMI 19.13 kg/m  Last Weight:  Wt Readings from Last 1 Encounters:  11/05/16 145 lb (65.8 kg)   Last Height:   Ht Readings from Last 1 Encounters:  11/05/16 6\' 1"  (1.854 m)    MS: pleasant, alert, minimally interactive  Speech: fluent w/o paraphasic error, hypophonic  Memory: impaired recent and remote recall. He says it is January, doesn't know the month. Doesn't know the year. He knows his birthdate.  Difficulty following commands such as when asked to do finger taps.   CN: PERRL, Impaired upgaze otherwise EOMI no nystagmus, no ptosis, sensation intact to LT V1-V3 bilat, face symmetric, no weakness, hearing grossly intact, palate elevates symmetrically, shoulder shrug 5/5 bilat,  tongue protrudes midline, no fasiculations noted.  Motor:Difficulty following comands to do a full motor exam, he has bilateral hip flexion weakness but otherwise appears intact. Upper extremity mild cogwheeling  Resting and  postural tremor seen on exam in the past not noticed today. He took his Sinemet at 7:30am.  Coordination: Bradykinesia finger tap  Reflexes: symmetrical, bilat downgoing toes  Sens: LT intact in all extremities  Gait: cannot get out of wheelchair unassisted, few steps shuffling, decreased arm swing, slow, en-bloc turning  Speech: hypophonic, no dysarthria or aphasia   Facial Expression:masked facies    Assessment/Plan:Mr Hermann is a pleasant 83y/o male with Parkinson's disease, REM sleep disorder, dementia, Sialorrhea and increased secretions, and continued worsening ability to transfer or stand independently. Presenting for follow up visit. He has worsened ability to stand or transfer independently and significantly decreased mobility with the inability to adjust his own bed sheets or adjust his position during the night. A semi-electric hospital bed. This will allow him to more easily have frequent changes in body position to prevent skin breakdown and to help prevent contractures, and to allow easier transfer from bed to wheelchair.  Also due to his increased secretions which is very common in Parkinson's disease , he should be able to raise the head of the bed which will help prevent aspiration of secretions. The patient may still has the capability to operate the controls for himself. Stable today, cont current management.  Continue long acting Carbidopa Levodopa at night. Continue Carbidopa/Levodopa 25-100 every three hours in the day 1.5 tabs then long-acting an hour before bed Continue Namenda. Discussed side effects with daughter and asked her to stop for anything concerning especially a rash. Watch for dizziness, somnolence, worsening gait, worsening mental status, diarrhea or any GI side effects, and others. Watch for falls, patient is a fall risk. Refilled all meds RTC in 9 months  Sarina Ill, MD  [facility] [address] [place], [place] [address]  Phone [phone] Fax [phone]  A total of 15 minutes was spent face-to-face with this patient. Over half this time was spent on counseling patient on the parkinson's disease diagnosis and different diagnostic and therapeutic options available.

## 2017-09-19 ENCOUNTER — Emergency Department (HOSPITAL_COMMUNITY): Payer: Medicare Other

## 2017-09-19 ENCOUNTER — Encounter (HOSPITAL_COMMUNITY): Payer: Self-pay

## 2017-09-19 ENCOUNTER — Other Ambulatory Visit: Payer: Self-pay

## 2017-09-19 ENCOUNTER — Inpatient Hospital Stay (HOSPITAL_COMMUNITY)
Admission: EM | Admit: 2017-09-19 | Discharge: 2017-09-20 | DRG: 871 | Disposition: A | Discharge status: Hospice | Payer: Medicare Other | Attending: Internal Medicine | Admitting: Internal Medicine

## 2017-09-19 DIAGNOSIS — Z66 Do not resuscitate: Secondary | ICD-10-CM | POA: Diagnosis present

## 2017-09-19 DIAGNOSIS — N400 Enlarged prostate without lower urinary tract symptoms: Secondary | ICD-10-CM | POA: Diagnosis present

## 2017-09-19 DIAGNOSIS — J690 Pneumonitis due to inhalation of food and vomit: Secondary | ICD-10-CM | POA: Diagnosis not present

## 2017-09-19 DIAGNOSIS — A419 Sepsis, unspecified organism: Principal | ICD-10-CM | POA: Diagnosis present

## 2017-09-19 DIAGNOSIS — E870 Hyperosmolality and hypernatremia: Secondary | ICD-10-CM | POA: Diagnosis present

## 2017-09-19 DIAGNOSIS — T68XXXA Hypothermia, initial encounter: Secondary | ICD-10-CM

## 2017-09-19 DIAGNOSIS — R1310 Dysphagia, unspecified: Secondary | ICD-10-CM | POA: Diagnosis not present

## 2017-09-19 DIAGNOSIS — Z87891 Personal history of nicotine dependence: Secondary | ICD-10-CM

## 2017-09-19 DIAGNOSIS — E860 Dehydration: Secondary | ICD-10-CM

## 2017-09-19 DIAGNOSIS — I2699 Other pulmonary embolism without acute cor pulmonale: Secondary | ICD-10-CM | POA: Diagnosis present

## 2017-09-19 DIAGNOSIS — F0280 Dementia in other diseases classified elsewhere without behavioral disturbance: Secondary | ICD-10-CM | POA: Diagnosis present

## 2017-09-19 DIAGNOSIS — I10 Essential (primary) hypertension: Secondary | ICD-10-CM | POA: Diagnosis present

## 2017-09-19 DIAGNOSIS — M1990 Unspecified osteoarthritis, unspecified site: Secondary | ICD-10-CM | POA: Diagnosis present

## 2017-09-19 DIAGNOSIS — Z8249 Family history of ischemic heart disease and other diseases of the circulatory system: Secondary | ICD-10-CM

## 2017-09-19 DIAGNOSIS — Z820 Family history of epilepsy and other diseases of the nervous system: Secondary | ICD-10-CM

## 2017-09-19 DIAGNOSIS — Z515 Encounter for palliative care: Secondary | ICD-10-CM | POA: Diagnosis not present

## 2017-09-19 DIAGNOSIS — I824Z2 Acute embolism and thrombosis of unspecified deep veins of left distal lower extremity: Secondary | ICD-10-CM | POA: Diagnosis present

## 2017-09-19 DIAGNOSIS — G20 Parkinson's disease: Secondary | ICD-10-CM | POA: Diagnosis present

## 2017-09-19 DIAGNOSIS — F0281 Dementia in other diseases classified elsewhere with behavioral disturbance: Secondary | ICD-10-CM

## 2017-09-19 DIAGNOSIS — Z8042 Family history of malignant neoplasm of prostate: Secondary | ICD-10-CM | POA: Diagnosis not present

## 2017-09-19 LAB — COMPREHENSIVE METABOLIC PANEL
ALK PHOS: 78 U/L (ref 38–126)
ALT: 5 U/L — ABNORMAL LOW (ref 17–63)
ANION GAP: 8 (ref 5–15)
AST: 11 U/L — ABNORMAL LOW (ref 15–41)
Albumin: 2.9 g/dL — ABNORMAL LOW (ref 3.5–5.0)
BILIRUBIN TOTAL: 0.8 mg/dL (ref 0.3–1.2)
BUN: 38 mg/dL — ABNORMAL HIGH (ref 6–20)
CALCIUM: 9 mg/dL (ref 8.9–10.3)
CO2: 29 mmol/L (ref 22–32)
Chloride: 113 mmol/L — ABNORMAL HIGH (ref 101–111)
Creatinine, Ser: 1.72 mg/dL — ABNORMAL HIGH (ref 0.61–1.24)
GFR calc non Af Amer: 33 mL/min — ABNORMAL LOW (ref >60)
GFR, EST AFRICAN AMERICAN: 39 mL/min — AB (ref >60)
GLUCOSE: 123 mg/dL — AB (ref 65–99)
Potassium: 4.3 mmol/L (ref 3.5–5.1)
Sodium: 150 mmol/L — ABNORMAL HIGH (ref 135–145)
TOTAL PROTEIN: 6.1 g/dL — AB (ref 6.5–8.1)

## 2017-09-19 LAB — CBC WITH DIFFERENTIAL/PLATELET
Basophils Absolute: 0 10*3/uL (ref 0.0–0.1)
Basophils Relative: 0 %
Eosinophils Absolute: 0 10*3/uL (ref 0.0–0.7)
Eosinophils Relative: 1 %
HEMATOCRIT: 35 % — AB (ref 39.0–52.0)
HEMOGLOBIN: 10.8 g/dL — AB (ref 13.0–17.0)
LYMPHS PCT: 7 %
Lymphs Abs: 0.5 10*3/uL — ABNORMAL LOW (ref 0.7–4.0)
MCH: 29.8 pg (ref 26.0–34.0)
MCHC: 30.9 g/dL (ref 30.0–36.0)
MCV: 96.7 fL (ref 78.0–100.0)
MONOS PCT: 4 %
Monocytes Absolute: 0.3 10*3/uL (ref 0.1–1.0)
NEUTROS ABS: 6.1 10*3/uL (ref 1.7–7.7)
NEUTROS PCT: 88 %
Platelets: 180 10*3/uL (ref 150–400)
RBC: 3.62 MIL/uL — AB (ref 4.22–5.81)
RDW: 15.7 % — ABNORMAL HIGH (ref 11.5–15.5)
WBC: 6.9 10*3/uL (ref 4.0–10.5)

## 2017-09-19 LAB — AMMONIA: Ammonia: 38 umol/L — ABNORMAL HIGH (ref 9–35)

## 2017-09-19 LAB — I-STAT TROPONIN, ED: Troponin i, poc: 0 ng/mL (ref 0.00–0.08)

## 2017-09-19 LAB — I-STAT CG4 LACTIC ACID, ED: Lactic Acid, Venous: 1.93 mmol/L — ABNORMAL HIGH (ref 0.5–1.9)

## 2017-09-19 MED ORDER — ONDANSETRON 4 MG PO TBDP: 4.0000 mg | ORAL_TABLET | Freq: Four times a day (QID) | ORAL | Status: DC | PRN

## 2017-09-19 MED ORDER — BIOTENE DRY MOUTH MT LIQD
15.0000 mL | OROMUCOSAL | Status: DC | PRN
  Administered 2017-09-20 (×2): 15 mL via TOPICAL
  Filled 2017-09-19 (×2): qty 15

## 2017-09-19 MED ORDER — ONDANSETRON HCL 4 MG/2ML IJ SOLN: 4.0000 mg | Freq: Four times a day (QID) | INTRAMUSCULAR | Status: DC | PRN

## 2017-09-19 MED ORDER — GLYCOPYRROLATE 1 MG PO TABS
1.0000 mg | ORAL_TABLET | ORAL | Status: DC | PRN
  Filled 2017-09-19: qty 1

## 2017-09-19 MED ORDER — BISACODYL 10 MG RE SUPP: 10.0000 mg | Freq: Every day | RECTAL | Status: DC | PRN

## 2017-09-19 MED ORDER — GLYCOPYRROLATE 0.2 MG/ML IJ SOLN
0.2000 mg | INTRAMUSCULAR | Status: DC | PRN
  Filled 2017-09-19: qty 1

## 2017-09-19 MED ORDER — SODIUM CHLORIDE 0.9 % IV SOLN
2.0000 g | Freq: Once | INTRAVENOUS | Status: DC
  Filled 2017-09-19: qty 2

## 2017-09-19 MED ORDER — MORPHINE SULFATE (CONCENTRATE) 10 MG/0.5ML PO SOLN: 5.0000 mg | ORAL | Status: DC | PRN

## 2017-09-19 MED ORDER — VANCOMYCIN HCL IN DEXTROSE 1-5 GM/200ML-% IV SOLN: 1000.0000 mg | Freq: Once | INTRAVENOUS | Status: DC

## 2017-09-19 MED ORDER — LORAZEPAM 2 MG/ML PO CONC
1.0000 mg | ORAL | Status: DC | PRN
  Administered 2017-09-19 – 2017-09-20 (×2): 1 mg via SUBLINGUAL
  Filled 2017-09-19 (×2): qty 1

## 2017-09-19 MED ORDER — POLYVINYL ALCOHOL 1.4 % OP SOLN
1.0000 [drp] | Freq: Four times a day (QID) | OPHTHALMIC | Status: DC | PRN
  Filled 2017-09-19 (×2): qty 15

## 2017-09-19 NOTE — H&P (Addendum)
History and Physical    Wissam Resor WLN:[id number] DOB: May 31, 1926 DOA: 09/19/2017  Referring MD/NP/PA: Gerald Leitz PCP: Antony Contras, MD   Patient coming from: home  Chief Complaint: choking  HPI: Chad Cooke is a 84 y.o. male with history of Parkinson's disease, dementia, hypertension, BPH who has had a progressive decline over the last 6 months and particularly over the last 1 month.  He mostly rests in bed or transfers to a chair.  He speaks gibberish at times and does not routinely recognize family members.  He has been coughing with liquids and family has been thickening his drinks and encouraging him to eat and drink when able, but he is less and less interested in eating and drinking.  This morning, he started choking after eating some breakfast and family called EMS for transport to the ER.  ED Course: Vital signs T 94.4 and started on warmer.  Labs:  Sodium 150, creatinine mildly elevated.  Lactic acid 1.9, WBC 6.9  CXR demonstrated aspiration pneumonia.     Review of Systems:  Patient obtunded, unable to obtain Complete 12 point review of systems reviewed with patient and negative except as mentioned above.    Past Medical History:  Diagnosis Date  . Dementia   . Enlarged prostate   . Hypercholesteremia   . Hypertension   . Osteoarthritis   . Parkinson disease [facility])     Past Surgical History:  Procedure Laterality Date  . None       reports that he has quit smoking. He has never used smokeless tobacco. He reports that he does not drink alcohol or use drugs.  No Known Allergies  Family History  Problem Relation Age of Onset  . Hypertension Mother   . Alzheimer's disease Maternal Grandmother   . Prostate cancer Brother     Prior to Admission medications   Medication Sig Start Date End Date Taking? Authorizing Provider  acetaminophen (TYLENOL) 500 MG tablet Take 500 mg by mouth every 6 (six) hours as needed for mild pain or moderate pain.    Yes [provider]  aspirin 81 MG tablet Take 81 mg by mouth daily.   Yes [provider]  carbidopa-levodopa (SINEMET IR) 25-100 MG tablet TAKE 1.5 TABLETS BY MOUTH 4 TIMES DAILY 11/05/16  Yes Melvenia Beam, MD  Carbidopa-Levodopa ER (SINEMET CR) 25-100 MG tablet controlled release Take 1 tablet by mouth at bedtime. 11/05/16  Yes Melvenia Beam, MD  Cholecalciferol (VITAMIN D PO) Take by mouth daily.   Yes [provider]  donepezil (ARICEPT) 10 MG tablet Take 1 tablet (10 mg total) by mouth at bedtime. 11/05/16  Yes Melvenia Beam, MD  Melatonin 3 MG TABS Take by mouth at bedtime.   Yes [provider]  memantine (NAMENDA) 10 MG tablet TAKE 1 TABLET BY MOUTH TWO  TIMES DAILY EQUIVALENT TO [facility] 11/05/16  Yes Melvenia Beam, MD  QUEtiapine (SEROQUEL) 25 MG tablet Take 1 tablet (25 mg total) by mouth 3 (three) times daily. 11/05/16  Yes Melvenia Beam, MD    Physical Exam: Vitals:   09/19/17 1124 09/19/17 1125 09/19/17 1420  BP: (!) 173/101 (!) 173/101 (!) 183/127  Pulse: (!) 27 66 73  Resp: 17 12 13   Temp:  (!) 94.4 F (34.7 C)   TempSrc:  Rectal   SpO2: 91% 99% 100%    Constitutional:  Frail adult male, NAD, calm, comfortable Eyes: PERRL, lids and conjunctivae normal ENMT:  Dry mucous  membranes (mouth breathing).  Oropharynx nonerythematous, no exudates.   Neck:  No nuchal rigidity, no masses Respiratory:  No wheezes, rales, or rhonchi Cardiovascular: Regular rate and rhythm, no murmurs / rubs / gallops.  2+ radial pulses. Abdomen:  Normal active bowel sounds, soft, nondistended, nontender Musculoskeletal: Normal muscle tone and bulk.  No contractures.  1+ pitting edema of the left lower extremity  Skin:  no rashes, abrasions, or ulcers Neurologic:  Asleep, barely opens eyes, occasional myoclonic jerks Psychiatric:  obtunded  Labs on Admission: I have personally reviewed following labs and imaging studies  CBC: Recent Labs  Lab 09/19/17 1309    WBC 6.9  NEUTROABS 6.1  HGB 10.8*  HCT 35.0*  MCV 96.7  PLT 409   Basic Metabolic Panel: Recent Labs  Lab 09/19/17 1309  NA 150*  K 4.3  CL 113*  CO2 29  GLUCOSE 123*  BUN 38*  CREATININE 1.72*  CALCIUM 9.0   GFR: CrCl cannot be calculated (Unknown ideal weight.). Liver Function Tests: Recent Labs  Lab 09/19/17 1309  AST 11*  ALT 5*  ALKPHOS 78  BILITOT 0.8  PROT 6.1*  ALBUMIN 2.9*   No results for input(s): LIPASE, AMYLASE in the last 168 hours. Recent Labs  Lab 09/19/17 1309  AMMONIA 38*   Coagulation Profile: No results for input(s): INR, PROTIME in the last 168 hours. Cardiac Enzymes: No results for input(s): CKTOTAL, CKMB, CKMBINDEX, TROPONINI in the last 168 hours. BNP (last 3 results) No results for input(s): PROBNP in the last 8760 hours. HbA1C: No results for input(s): HGBA1C in the last 72 hours. CBG: No results for input(s): GLUCAP in the last 168 hours. Lipid Profile: No results for input(s): CHOL, HDL, LDLCALC, TRIG, CHOLHDL, LDLDIRECT in the last 72 hours. Thyroid Function Tests: No results for input(s): TSH, T4TOTAL, FREET4, T3FREE, THYROIDAB in the last 72 hours. Anemia Panel: No results for input(s): VITAMINB12, FOLATE, FERRITIN, TIBC, IRON, RETICCTPCT in the last 72 hours. Urine analysis:    Component Value Date/Time   COLORURINE AMBER (A) 01/28/2015 1348   APPEARANCEUR CLEAR 01/28/2015 1348   LABSPEC 1.025 01/28/2015 1348   PHURINE 6.0 01/28/2015 1348   GLUCOSEU NEGATIVE 01/28/2015 1348   HGBUR NEGATIVE 01/28/2015 1348   BILIRUBINUR SMALL (A) 01/28/2015 1348   KETONESUR 15 (A) 01/28/2015 1348   PROTEINUR 30 (A) 01/28/2015 1348   UROBILINOGEN 2.0 (H) 01/28/2015 1348   NITRITE NEGATIVE 01/28/2015 1348   LEUKOCYTESUR NEGATIVE 01/28/2015 1348   Sepsis Labs: @LABRCNTIP (procalcitonin:4,lacticidven:4) )No results found for this or any previous visit (from the past 240 hour(s)).   Radiological Exams on Admission: Ct Head Wo  Contrast  Result Date: 09/19/2017 CLINICAL DATA:  Altered mental status. EXAM: CT HEAD WITHOUT CONTRAST TECHNIQUE: Contiguous axial images were obtained from the base of the skull through the vertex without intravenous contrast. COMPARISON:  CT scan of Oct 24, 2014. FINDINGS: Brain: Mild diffuse cortical atrophy is noted. Mild chronic ischemic white matter disease is noted. No mass effect or midline shift is noted. Ventricular size is within normal limits. There is no evidence of mass lesion, hemorrhage or acute infarction. Vascular: No hyperdense vessel or unexpected calcification. Skull: Normal. Negative for fracture or focal lesion. Sinuses/Orbits: No acute finding. Other: None. IMPRESSION: Mild diffuse cortical atrophy. Mild chronic ischemic white matter disease. No acute intracranial abnormality seen. Electronically Signed   By: Marijo Conception, M.D.   On: 09/19/2017 12:58   Dg Chest Portable 1 View  Result Date: 09/19/2017 CLINICAL DATA:  Shortness of breath.  Former smoker. EXAM: PORTABLE CHEST 1 VIEW COMPARISON:  01/28/2015; 07/12/2013 FINDINGS: Grossly unchanged cardiac silhouette and mediastinal contours given kyphotic projection. Bilateral infrahilar heterogeneous opacities. No definite pleural effusion or pneumothorax. No evidence of edema. No acute osseus abnormalities. IMPRESSION: Bilateral infrahilar opacities, potentially accentuated due to kyphotic projection, and while potentially representative of atelectasis, developing multifocal pneumonia could have a similar appearance. Clinical correlation is advised. Further evaluation with a PA and lateral chest radiograph may be obtained as clinically indicated. Electronically Signed   By: Sandi Mariscal M.D.   On: 09/19/2017 12:33    Assessment/Plan Active Problems:   Aspiration pneumonia ([place])    Aspiration pneumonia secondary to choking from progressive parkinson's dementia and dysphagia.  Family has elected comfort measures and would like to  have him admitted to [facility] place if space available.   -  D/c IVF and antibiotics -  Morphine conc sublingual for pain and SOB -  Ativan sublingual for agitation -  Glycopyrrolate prn secretions -  Okay to d/c telemetry -  No blood draws or fingersticks -  No DVT prophylaxis -  Admit to med-surg while awaiting hospice consultation.  Family does not want him to return home in current state as they have children in the home.    Hypernatremia due to poor oral intake -  No IVF  Hypothermia likely due to early sepsis from aspiration pneumonia  LLE swelling may represent DVT and events described above may be due to PE -  No further work up, comfort measures  Parkinson's dementia -  Unable to take oral carbidopa/levodopa -  Hold all oral medications  DVT prophylaxis: none  Code Status: DNR Family Communication:  Patient and daughter and son-in-law Disposition Plan: to residential hospice when bed available   Consults called: palliative care  Admission status: inpatient    Janece Canterbury MD [facility] Pager [phone]  If 7PM-7AM, please contact night-coverage [email or web address] Password [facility]  09/19/2017, 2:46 PM

## 2017-09-19 NOTE — ED Notes (Signed)
Pt is alert with eye opening response to voice. Pt is non-verbal. Pt has core temp of 94.4. Berr Hugger was placed. Pt family at bedside.

## 2017-09-19 NOTE — ED Provider Notes (Signed)
[place] DEPT Provider Note   CSN: [id number] Arrival date & time: 09/19/17  1103     History   Chief Complaint Chief Complaint  Patient presents with  . Choking    HPI Chad Cooke is a 83 y.o. male.  HPI  Patient is an 84 year old male with advanced Parkinson's and dementia.  He is presenting today with altered mental status.  Reportedly he was eating at home (family members chop of all of his food at home) start having choking episodes and has had any became altered according to family he was reaching in the back of his throat, they thought he was choking.  Here he is relatively nonresponsive..  On arrival here patient making small guppy breaths.  Noted to be hypothermic.  Discussion had the family, precision to make DNR.    Past Medical History:  Diagnosis Date  . Dementia   . Enlarged prostate   . Hypercholesteremia   . Hypertension   . Osteoarthritis   . Parkinson disease [facility])     Patient Active Problem List   Diagnosis Date Noted  . Parkinson disease ([place]) 09/08/2015  . Parkinson's disease dementia ([place]) 09/08/2015  . Difficulty transferring 09/08/2015  . Paralysis agitans ([place]) 03/03/2013    Past Surgical History:  Procedure Laterality Date  . None          Home Medications    Prior to Admission medications   Medication Sig Start Date End Date Taking? Authorizing Provider  acetaminophen (TYLENOL) 500 MG tablet Take 500 mg by mouth every 6 (six) hours as needed for mild pain or moderate pain.     [provider]  aspirin 81 MG tablet Take 81 mg by mouth daily.    [provider]  carbidopa-levodopa (SINEMET IR) 25-100 MG tablet TAKE 1.5 TABLETS BY MOUTH 4 TIMES DAILY 11/05/16   Melvenia Beam, MD  Carbidopa-Levodopa ER (SINEMET CR) 25-100 MG tablet controlled release Take 1 tablet by mouth at bedtime. 11/05/16   Melvenia Beam, MD  Cholecalciferol (VITAMIN D PO) Take by mouth daily.    [provider]  donepezil (ARICEPT) 10 MG tablet Take 1 tablet (10 mg total) by mouth at bedtime. 11/05/16   Melvenia Beam, MD  Melatonin 3 MG TABS Take by mouth at bedtime.    [provider]  memantine (NAMENDA) 10 MG tablet TAKE 1 TABLET BY MOUTH TWO  TIMES DAILY EQUIVALENT TO [facility] 11/05/16   Melvenia Beam, MD  Multiple Vitamins-Minerals (MULTIVITAMIN WITH MINERALS) tablet Take 0.5 tablets by mouth daily.    [provider]  QUEtiapine (SEROQUEL) 25 MG tablet Take 1 tablet (25 mg total) by mouth 3 (three) times daily. 11/05/16   Melvenia Beam, MD    Family History Family History  Problem Relation Age of Onset  . Hypertension Mother   . Alzheimer's disease Maternal Grandmother   . Prostate cancer Brother     Social History Social History   Tobacco Use  . Smoking status: Former Research scientist (life sciences)  . Smokeless tobacco: Never Used  Substance Use Topics  . Alcohol use: No  . Drug use: No     Allergies   Patient has no known allergies.   Review of Systems Review of Systems  Unable to perform ROS: Patient nonverbal     Physical Exam Updated Vital Signs BP (!) 173/101 (BP Location: Left Arm)   Pulse 66   Temp (!) 94.4 F (34.7 C) (Rectal)   Resp  12   SpO2 99%   Physical Exam  Constitutional: He appears well-nourished.  Frail elderly male, altered.  Mouth remains open, shallow guppy breaths  HENT:  Head: Normocephalic.  Eyes: Conjunctivae are normal.  Patient opens his eyes in response to pain.  Cardiovascular: Normal rate.  Pulmonary/Chest: Effort normal.  Odd shallow breaths.  No wheezing noted.  Abdominal: He exhibits no distension. There is no guarding.  Musculoskeletal:  Upper limbs fairly contracted  Neurological:  Nonverbal.  Skin: Skin is warm and dry. He is not diaphoretic.     ED Treatments / Results  Labs (all labs ordered are listed, but only abnormal results are displayed) Labs Reviewed  CBC WITH DIFFERENTIAL/PLATELET    COMPREHENSIVE METABOLIC PANEL  AMMONIA  URINALYSIS, ROUTINE W REFLEX MICROSCOPIC  I-STAT CG4 LACTIC ACID, ED  I-STAT TROPONIN, ED    EKG None  Radiology No results found.  Procedures Procedures (including critical care time)  Medications Ordered in ED Medications - No data to display   Initial Impression / Assessment and Plan / ED Course  I have reviewed the triage vital signs and the nursing notes.  Pertinent labs & imaging results that were available during my care of the patient were reviewed by me and considered in my medical decision making (see chart for details).      Patient is an 84 year old male with advanced Parkinson's and dementia.  He is presenting today with altered mental status.  Reportedly he was eating at home (family members chop of all of his food at home) start having choking episodes and has had any became altered according to family he was reaching in the back of his throat, they thought he was choking.  Here he is relatively nonresponsive..  On arrival here patient making small guppy breaths.  Noted to be hypothermic.  Discussion had the family, precision to make DNR.  12:14 PM Family thinking they are ready to move to comfort care.  However it is very unclear what is actually going on today.  We will do some lab work and x-ray and CT.  Will consult palliative care.  Palliative care will come to consult in the ED.  Patient has bilateral pneumonia, likely aspiration.  Will treat with broad spectrum antibiotics.  Patient continued to be on bair hugger.  Family is moving slowly towards comfort care.  Discussed with hospitalist.  Will admit.  Final Clinical Impressions(s) / ED Diagnoses   Final diagnoses:  None    ED Discharge Orders    None       Macarthur Critchley, MD 09/19/17 [phone]

## 2017-09-19 NOTE — ED Notes (Signed)
Pt placed on bair hugger per RN on high.

## 2017-09-19 NOTE — ED Triage Notes (Signed)
Pt arrived from home. Pt resides with DTR. Pt was eating some food/ that was chopped up and per EMS reports that believe pt aspirated on food. {t was reaching at his throat and making gurgling sounds per pt dtr.  Pt has rhonchi noted to lung fields. Pt is breathing normal and unlabored.   EMS v/s 170/98 HR  74 RR 18, 97% RA CBG 114 18g L AC.   Hx parkinson/dementia

## 2017-09-19 NOTE — Progress Notes (Signed)
A consult was received from an ED physician for Vancomycin per pharmacy dosing.  The patient's profile has been reviewed for ht/wt/allergies/indication/available labs.   A one time order has been placed for Vanc 1gm IV.  Further antibiotics/pharmacy consults should be ordered by admitting physician if indicated.                       Thank you, Biagio Borg 09/19/2017  1:59 PM

## 2017-09-20 DIAGNOSIS — Z515 Encounter for palliative care: Secondary | ICD-10-CM | POA: Diagnosis not present

## 2017-09-20 DIAGNOSIS — J690 Pneumonitis due to inhalation of food and vomit: Secondary | ICD-10-CM | POA: Diagnosis not present

## 2017-09-20 MED ORDER — GLYCOPYRROLATE 1 MG PO TABS: 1.0000 mg | ORAL_TABLET | ORAL | Status: AC | PRN

## 2017-09-20 MED ORDER — POLYVINYL ALCOHOL 1.4 % OP SOLN: 1.0000 [drp] | Freq: Four times a day (QID) | OPHTHALMIC | 0 refills | Status: AC | PRN

## 2017-09-20 MED ORDER — LIP MEDEX EX OINT: TOPICAL_OINTMENT | CUTANEOUS | 0 refills | Status: AC | PRN

## 2017-09-20 MED ORDER — LORAZEPAM 2 MG/ML PO CONC: 1.0000 mg | ORAL | 0 refills | Status: AC | PRN

## 2017-09-20 MED ORDER — BISACODYL 10 MG RE SUPP: 10.0000 mg | Freq: Every day | RECTAL | 0 refills | Status: AC | PRN

## 2017-09-20 MED ORDER — ONDANSETRON 4 MG PO TBDP: 4.0000 mg | ORAL_TABLET | Freq: Four times a day (QID) | ORAL | 0 refills | Status: AC | PRN

## 2017-09-20 MED ORDER — ONDANSETRON HCL 4 MG/2ML IJ SOLN: 4.0000 mg | Freq: Four times a day (QID) | INTRAMUSCULAR | 0 refills | Status: AC | PRN

## 2017-09-20 MED ORDER — LIP MEDEX EX OINT
TOPICAL_OINTMENT | CUTANEOUS | Status: DC | PRN
Start: 2017-09-20 — End: 2017-09-20
  Filled 2017-09-20: qty 7

## 2017-09-20 MED ORDER — GLYCOPYRROLATE 0.2 MG/ML IJ SOLN: 0.2000 mg | INTRAMUSCULAR | Status: AC | PRN

## 2017-09-20 MED ORDER — MORPHINE SULFATE (CONCENTRATE) 10 MG/0.5ML PO SOLN: 5.0000 mg | ORAL | Status: AC | PRN

## 2017-09-20 NOTE — Progress Notes (Signed)
[facility] Liaison: RN visit  Received request from Dr. Loistine Chance for family interest in [facility] with request to transfer today. Chart reviewed and patient approved. Met with [name], daughter to confirm interest and explain services. Family agreeable to transfer today. Caryl Pina, [place] aware. Registration paperwork completed. Dr. Orpah Melter to assume care per family request. Please fax discharge summary to [phone]. RN please call report to [phone]. Please arrange transport for patient to arrive as soon as possible.   Thank you for this referral.  Please call with any hospice related questions.  Farrel Gordon, RN, [facility] Liaison [phone]   [facility] Liaisons are on AMION

## 2017-09-20 NOTE — Discharge Summary (Signed)
[facility]  Physician Discharge Summary   Patient ID: Chad Cooke MRN: [id number] DOB/AGE: [birth year] 84 y.o.  Admit date: 09/19/2017 Discharge date: 09/20/2017  PCP: Antony Contras, MD  DISCHARGE DIAGNOSES:  Active Problems:   Aspiration pneumonia ([place])   RECOMMENDATIONS FOR OUTPATIENT FOLLOW UP: 1. Patient being discharged to residential hospice  DISCHARGE CONDITION: stable with poor prognosis   INITIAL HISTORY: 84 yr old African-American male with a past medical history of Parkinson's disease, dementia, hypertension BPH has had a progressive decline over the last 6 months with worsening in his dementia and inability to eat and drink.  He had a choking episodes on the day before breakfast and patient was brought into the hospital by EMS.  He was hospitalized for comfort care and end-of-life care.  With plans to place him in residential hospice.  Consultations:  Palliative medicine   HOSPITAL COURSE:   Aspiration pneumonia secondary to choking from progressive parkinson's dementia and dysphagia Family elected comfort measures.  Social worker consulted for placement to residential hospice.  Continue with the medications prescribed for comfort care. Family does not want him to return home in current state as they have children in the home.    Hypernatremia due to poor oral intake No IVF  Hypothermia likely due to early sepsis from aspiration pneumonia Stable  LLE swelling may represent DVT and events described above may be due to PE No further work up, comfort measures  Parkinson's dementia Unable to take oral carbidopa/levodopa. Hold all oral medications.  Comfort measures only.  Okay for discharge to residential hospice when bed is available.   PERTINENT LABS:  The results of significant diagnostics from this hospitalization (including imaging, microbiology, ancillary and laboratory) are listed below for reference.      Labs: Basic Metabolic  Panel: Recent Labs  Lab 09/19/17 1309  NA 150*  K 4.3  CL 113*  CO2 29  GLUCOSE 123*  BUN 38*  CREATININE 1.72*  CALCIUM 9.0   Liver Function Tests: Recent Labs  Lab 09/19/17 1309  AST 11*  ALT 5*  ALKPHOS 78  BILITOT 0.8  PROT 6.1*  ALBUMIN 2.9*    Recent Labs  Lab 09/19/17 1309  AMMONIA 38*   CBC: Recent Labs  Lab 09/19/17 1309  WBC 6.9  NEUTROABS 6.1  HGB 10.8*  HCT 35.0*  MCV 96.7  PLT 180    IMAGING STUDIES Ct Head Wo Contrast  Result Date: 09/19/2017 CLINICAL DATA:  Altered mental status. EXAM: CT HEAD WITHOUT CONTRAST TECHNIQUE: Contiguous axial images were obtained from the base of the skull through the vertex without intravenous contrast. COMPARISON:  CT scan of Oct 24, 2014. FINDINGS: Brain: Mild diffuse cortical atrophy is noted. Mild chronic ischemic white matter disease is noted. No mass effect or midline shift is noted. Ventricular size is within normal limits. There is no evidence of mass lesion, hemorrhage or acute infarction. Vascular: No hyperdense vessel or unexpected calcification. Skull: Normal. Negative for fracture or focal lesion. Sinuses/Orbits: No acute finding. Other: None. IMPRESSION: Mild diffuse cortical atrophy. Mild chronic ischemic white matter disease. No acute intracranial abnormality seen. Electronically Signed   By: Marijo Conception, M.D.   On: 09/19/2017 12:58   Dg Chest Portable 1 View  Result Date: 09/19/2017 CLINICAL DATA:  Shortness of breath.  Former smoker. EXAM: PORTABLE CHEST 1 VIEW COMPARISON:  01/28/2015; 07/12/2013 FINDINGS: Grossly unchanged cardiac silhouette and mediastinal contours given kyphotic projection. Bilateral infrahilar heterogeneous opacities. No definite pleural effusion or pneumothorax. No  evidence of edema. No acute osseus abnormalities. IMPRESSION: Bilateral infrahilar opacities, potentially accentuated due to kyphotic projection, and while potentially representative of atelectasis, developing multifocal  pneumonia could have a similar appearance. Clinical correlation is advised. Further evaluation with a PA and lateral chest radiograph may be obtained as clinically indicated. Electronically Signed   By: Sandi Mariscal M.D.   On: 09/19/2017 12:33    DISCHARGE EXAMINATION: See progress note from earlier today  DISPOSITION: Residential hospice     Allergies as of 09/20/2017   No Known Allergies     Medication List    STOP taking these medications   acetaminophen 500 MG tablet Commonly known as:  TYLENOL   aspirin 81 MG tablet   carbidopa-levodopa 25-100 MG tablet Commonly known as:  SINEMET IR   Carbidopa-Levodopa ER 25-100 MG tablet controlled release Commonly known as:  SINEMET CR   donepezil 10 MG tablet Commonly known as:  ARICEPT   Melatonin 3 MG Tabs   memantine 10 MG tablet Commonly known as:  NAMENDA   QUEtiapine 25 MG tablet Commonly known as:  SEROQUEL   VITAMIN D PO     TAKE these medications   bisacodyl 10 MG suppository Commonly known as:  DULCOLAX Place 1 suppository (10 mg total) rectally daily as needed for moderate constipation.   glycopyrrolate 1 MG tablet Commonly known as:  ROBINUL Take 1 tablet (1 mg total) by mouth every 4 (four) hours as needed (excessive secretions).   glycopyrrolate 0.2 MG/ML injection Commonly known as:  ROBINUL Inject 1 mL (0.2 mg total) into the skin every 4 (four) hours as needed (excessive secretions).   glycopyrrolate 0.2 MG/ML injection Commonly known as:  ROBINUL Inject 1 mL (0.2 mg total) into the vein every 4 (four) hours as needed (excessive secretions).   lip balm ointment Apply topically as needed for lip care.   LORazepam 2 MG/ML concentrated solution Commonly known as:  ATIVAN Place 0.5 mLs (1 mg total) under the tongue every 4 (four) hours as needed for anxiety, seizure, sedation or sleep.   morphine CONCENTRATE 10 MG/0.5ML Soln concentrated solution Place 0.25 mLs (5 mg total) under the tongue every  hour as needed for moderate pain, severe pain or shortness of breath (anxiety not relived by ativan).   ondansetron 4 MG disintegrating tablet Commonly known as:  ZOFRAN-ODT Take 1 tablet (4 mg total) by mouth every 6 (six) hours as needed for nausea.   ondansetron 4 MG/2ML Soln injection Commonly known as:  ZOFRAN Inject 2 mLs (4 mg total) into the vein every 6 (six) hours as needed for nausea.   polyvinyl alcohol 1.4 % ophthalmic solution Commonly known as:  LIQUIFILM TEARS Place 1 drop into both eyes 4 (four) times daily as needed for dry eyes.        TOTAL DISCHARGE TIME: 35 mins  [facility] Pager [phone]  09/20/2017, 3:13 PM

## 2017-09-20 NOTE — Progress Notes (Signed)
Attempted to call report at [facility]. No answer. Will try again.

## 2017-09-20 NOTE — Consult Note (Signed)
Consultation Note Date: 09/20/2017   Patient Name: Chad Cooke  DOB: 07/21/1925  MRN: [id number]  Age / Sex: 84 y.o., male  PCP: Chad Contras, MD Referring Physician: Bonnielee Haff, MD  Reason for Consultation: Inpatient hospice referral  HPI/Patient Profile: 84 y.o. male   admitted on 09/19/2017    Clinical Assessment and Goals of Care:  84 year old African-American male with a past medical history of Parkinson's disease, dementia, hypertension BPH has had a progressive decline over the last 6 months with worsening in his dementia and inability to eat and drink.  He had a choking episode    and patient was brought into the hospital by EMS.  He was hospitalized for comfort care and end-of-life care.  With plans to place him in residential hospice. A palliative consult hs been placed for assistance with hospice arrangements Patient is unresponsive and comfortable He is non verbal He has been given Ativan IV PRN for symptom management I discussed comfort care pathway and residential hospice arrangements with daughter next of kin Chad Cooke who is by his bedside. She is fully aware of hospice philosophy of care, patient's wife died in [place] 2 years ago.  NEXT OF KIN  daughter Chad Cooke.   SUMMARY OF RECOMMENDATIONS    DNR DNI COMFORT CARE Choices discussed, hospice of Chad Co[place] selected. Discussed with on call hospice liaison Chad Cooke, appreciate her input, await consult Prognosis less than 2 weeks  Code Status/Advance Care Planning:  DNR     Symptom Management:    as above   Palliative Prophylaxis:   Delirium Protocol  Additional Recommendations (Limitations, Scope, Preferences):  Full Comfort Care  Psycho-social/Spiritual:   Desire for further Chaplaincy support:yes  Additional Recommendations: Education on Hospice  Prognosis:   < 2  weeks  Discharge Planning: Hospice facility      Primary Diagnoses: Present on Admission: . Aspiration pneumonia ([place])   I have reviewed the medical record, interviewed the patient and family, and examined the patient. The following aspects are pertinent.  Past Medical History:  Diagnosis Date  . Dementia   . Enlarged prostate   . Hypercholesteremia   . Hypertension   . Osteoarthritis   . Parkinson disease [facility])    Social History   Socioeconomic History  . Marital status: Married    Spouse name: Chad Cooke  . Number of children: 1  . Years of education: Manufacturing engineer  . Highest education level: Not on file  Occupational History  . Occupation: Retired  Scientific laboratory technician  . Financial resource strain: Not on file  . Food insecurity:    Worry: Not on file    Inability: Not on file  . Transportation needs:    Medical: Not on file    Non-medical: Not on file  Tobacco Use  . Smoking status: Former Research scientist (life sciences)  . Smokeless tobacco: Never Used  Substance and Sexual Activity  . Alcohol use: No  . Drug use: No  . Sexual activity: Not on file  Lifestyle  . Physical  activity:    Days per week: Not on file    Minutes per session: Not on file  . Stress: Not on file  Relationships  . Social connections:    Talks on phone: Not on file    Gets together: Not on file    Attends religious service: Not on file    Active member of club or organization: Not on file    Attends meetings of clubs or organizations: Not on file    Relationship status: Not on file  Other Topics Concern  . Not on file  Social History Narrative   Patient is married, Chad Cooke), has 1child   Patient is retired.    Patient is right handed   Education level is Bachelor's degree    Caffeine consumption is 0      Family History  Problem Relation Age of Onset  . Hypertension Mother   . Alzheimer's disease Maternal Grandmother   . Prostate cancer Brother    Scheduled Meds: Continuous Infusions: PRN Meds:.antiseptic  oral rinse, bisacodyl, glycopyrrolate **OR** glycopyrrolate **OR** glycopyrrolate, LORazepam, morphine CONCENTRATE, ondansetron **OR** ondansetron (ZOFRAN) IV, polyvinyl alcohol Medications Prior to Admission:  Prior to Admission medications   Medication Sig Start Date End Date Taking? Authorizing Provider  acetaminophen (TYLENOL) 500 MG tablet Take 500 mg by mouth every 6 (six) hours as needed for mild pain or moderate pain.    Yes [provider]  aspirin 81 MG tablet Take 81 mg by mouth daily.   Yes [provider]  carbidopa-levodopa (SINEMET IR) 25-100 MG tablet TAKE 1.5 TABLETS BY MOUTH 4 TIMES DAILY 11/05/16  Yes Melvenia Beam, MD  Carbidopa-Levodopa ER (SINEMET CR) 25-100 MG tablet controlled release Take 1 tablet by mouth at bedtime. 11/05/16  Yes Melvenia Beam, MD  Cholecalciferol (VITAMIN D PO) Take by mouth daily.   Yes [provider]  donepezil (ARICEPT) 10 MG tablet Take 1 tablet (10 mg total) by mouth at bedtime. 11/05/16  Yes Melvenia Beam, MD  Melatonin 3 MG TABS Take by mouth at bedtime.   Yes [provider]  memantine (NAMENDA) 10 MG tablet TAKE 1 TABLET BY MOUTH TWO  TIMES DAILY EQUIVALENT TO [facility] 11/05/16  Yes Melvenia Beam, MD  QUEtiapine (SEROQUEL) 25 MG tablet Take 1 tablet (25 mg total) by mouth 3 (three) times daily. 11/05/16  Yes Melvenia Beam, MD   No Known Allergies Review of Systems Non verbal  Physical Exam General appearance: Lying in the bed with eyes closed.    Does not respond Head: Normocephalic, without obvious abnormality, atraumatic Resp: Diminished air entry at the bases.    Cardio: regular rate and rhythm, S1, S2 normal,  GI: Abdomen is soft.  Nontender. Extremities: extremities normal, atraumatic, no cyanosis or edema Neurologic: Not responding.  Rigidity is appreciated.  No other deficits present.  Vital Signs: BP (!) 156/85 (BP Location: Right Arm)   Pulse 90   Temp 98.8 F (37.1 C) (Oral)    Resp 13   SpO2 (!) 89%  Pain Scale: PAINAD   Pain Score: Asleep  PPS 10%  SpO2: SpO2: (!) 89 % O2 Device:SpO2: (!) 89 % O2 Flow Rate: .   IO: Intake/output summary: No intake or output data in the 24 hours ending 09/20/17 1235  LBM:   Baseline Weight:   Most recent weight:       Palliative Assessment/Data:     Time In:  11.30   Time Out:  12.30  Time Total:  60 min  Greater than 50%  of this time was spent counseling and coordinating care related to the above assessment and plan.  Signed by: Loistine Chance, MD  [phone]  Please contact Palliative Medicine Team phone at [phone] for questions and concerns.  For individual provider: See Shea Evans

## 2017-09-20 NOTE — Progress Notes (Signed)
Report called to [place] at [facility]. All questions answered.

## 2017-09-20 NOTE — Progress Notes (Addendum)
[facility] PROGRESS NOTE  Ember Gottwald JOA:[id number] DOB: 08-09-1925 DOA: 09/19/2017  PCP: Antony Contras, MD  Brief History/Interval Summary: 47yOld African-American male with a past medical history of Parkinson's disease, dementia, hypertension BPH has had a progressive decline over the last 6 months with worsening in his dementia and inability to eat and drink.  He had a choking episodes on the day before breakfast and patient was brought into the hospital by EMS.  He was hospitalized for comfort care and end-of-life care.  With plans to place him in residential hospice.  Reason for Visit: Comfort care  Consultants: None  Procedures: None  Antibiotics: None  Subjective/Interval History: Patient not responding this morning.  ROS: Unable to do  Objective:  Vital Signs  Vitals:   09/19/17 1420 09/19/17 1627 09/19/17 1747 09/19/17 2028  BP: (!) 183/127 (!) 147/112 131/69 (!) 156/85  Pulse: 73 79 82 90  Resp: 13  12 13   Temp:   98.8 F (37.1 C)   TempSrc:   Oral   SpO2: 100% 98% 98% (!) 89%   No intake or output data in the 24 hours ending 09/20/17 1227 There were no vitals filed for this visit.  General appearance: Lying in the bed with eyes closed.  Grimaces when I am examining him.  Does not respond. Head: Normocephalic, without obvious abnormality, atraumatic Resp: Diminished air entry at the bases.  No wheezing rales or rhonchi Cardio: regular rate and rhythm, S1, S2 normal, no murmur, click, rub or gallop GI: Abdomen is soft.  Nontender. Extremities: extremities normal, atraumatic, no cyanosis or edema Neurologic: Not responding.  Rigidity is appreciated.  No other deficits present.   Lab Results:  Data Reviewed: I have personally reviewed following labs and imaging studies  CBC: Recent Labs  Lab 09/19/17 1309  WBC 6.9  NEUTROABS 6.1  HGB 10.8*  HCT 35.0*  MCV 96.7  PLT 601    Basic Metabolic Panel: Recent Labs  Lab 09/19/17 1309  NA  150*  K 4.3  CL 113*  CO2 29  GLUCOSE 123*  BUN 38*  CREATININE 1.72*  CALCIUM 9.0    GFR: CrCl cannot be calculated (Unknown ideal weight.).  Liver Function Tests: Recent Labs  Lab 09/19/17 1309  AST 11*  ALT 5*  ALKPHOS 78  BILITOT 0.8  PROT 6.1*  ALBUMIN 2.9*     Recent Labs  Lab 09/19/17 1309  AMMONIA 38*     Radiology Studies: Ct Head Wo Contrast  Result Date: 09/19/2017 CLINICAL DATA:  Altered mental status. EXAM: CT HEAD WITHOUT CONTRAST TECHNIQUE: Contiguous axial images were obtained from the base of the skull through the vertex without intravenous contrast. COMPARISON:  CT scan of Oct 24, 2014. FINDINGS: Brain: Mild diffuse cortical atrophy is noted. Mild chronic ischemic white matter disease is noted. No mass effect or midline shift is noted. Ventricular size is within normal limits. There is no evidence of mass lesion, hemorrhage or acute infarction. Vascular: No hyperdense vessel or unexpected calcification. Skull: Normal. Negative for fracture or focal lesion. Sinuses/Orbits: No acute finding. Other: None. IMPRESSION: Mild diffuse cortical atrophy. Mild chronic ischemic white matter disease. No acute intracranial abnormality seen. Electronically Signed   By: Marijo Conception, M.D.   On: 09/19/2017 12:58   Dg Chest Portable 1 View  Result Date: 09/19/2017 CLINICAL DATA:  Shortness of breath.  Former smoker. EXAM: PORTABLE CHEST 1 VIEW COMPARISON:  01/28/2015; 07/12/2013 FINDINGS: Grossly unchanged cardiac silhouette and mediastinal contours given kyphotic  projection. Bilateral infrahilar heterogeneous opacities. No definite pleural effusion or pneumothorax. No evidence of edema. No acute osseus abnormalities. IMPRESSION: Bilateral infrahilar opacities, potentially accentuated due to kyphotic projection, and while potentially representative of atelectasis, developing multifocal pneumonia could have a similar appearance. Clinical correlation is advised. Further  evaluation with a PA and lateral chest radiograph may be obtained as clinically indicated. Electronically Signed   By: Sandi Mariscal M.D.   On: 09/19/2017 12:33     Medications: Scheduled:  Continuous:  BHA:[id number] oral rinse, bisacodyl, glycopyrrolate **OR** glycopyrrolate **OR** glycopyrrolate, LORazepam, morphine CONCENTRATE, ondansetron **OR** ondansetron (ZOFRAN) IV, polyvinyl alcohol  Assessment/Plan:  Active Problems:   Aspiration pneumonia ([place])     Aspiration pneumonia secondary to choking from progressive parkinson's dementia and dysphagia Family elected comfort measures.  Social worker consulted for placement to residential hospice.  Continue with the medications prescribed for comfort care. Family does not want him to return home in current state as they have children in the home.    Hypernatremia due to poor oral intake No IVF  Hypothermia likely due to early sepsis from aspiration pneumonia Stable  LLE swelling may represent DVT and events described above may be due to PE No further work up, comfort measures  Parkinson's dementia Unable to take oral carbidopa/levodopa. Hold all oral medications.  Comfort measures only.   DVT Prophylaxis: Comfort care    Code Status: DNR Family Communication: No family at bedside Disposition Plan: Await placement to residential hospice.    LOS: 1 day   [facility] Pager [phone] 09/20/2017, 12:27 PM  If 7PM-7AM, please contact night-coverage at [email or web address], password [facility]

## 2017-09-20 NOTE — Progress Notes (Signed)
CSW was consulted to assist patient in transitioning to [facility]. Latanya Presser stated she will reach out to family to do paper work to admit patient into [facility]. MD is aware of patient bed availability.    Clinical Social Worker facilitated patient discharge including contacting patient family and facility to confirm patient discharge plans.  Clinical information faxed to facility and family agreeable with plan.  CSW arranged ambulance transport via [facility] to [facility] .  Maryanne from palliative stated she has given report number to RN for her to contact facility for report.  Clinical Social Worker will sign off for now as social work intervention is no longer needed. Please consult [place] again if new need arises.  Rhea Pink, MSW, [place]

## 2017-10-15 DEATH — deceased

## 2017-10-28 ENCOUNTER — Telehealth: Payer: Self-pay | Admitting: Neurology

## 2017-10-28 NOTE — Telephone Encounter (Signed)
fyi Chad Cooke

## 2017-10-28 NOTE — Telephone Encounter (Signed)
Pt daughter(on DPR) just called to inform office that on 03-Oct-2017 pt passed away. No call back requested

## 2017-10-29 NOTE — Telephone Encounter (Signed)
Spoke with pt's daughter and gave her our condolences for the loss of her father. She verbalized appreciation.

## 2018-10-04 IMAGING — CT CT HEAD W/O CM
3 series · 16 of 47 positions shown, 19 images · non-contrast
Comparison: CT scan of October 24, 2014.

CLINICAL DATA: Altered mental status.

EXAM:
CT HEAD WITHOUT CONTRAST
TECHNIQUE: Contiguous axial images were obtained from the base of the skull
through the vertex without intravenous contrast.

[Series 2: head wo · axial · 0.47mm/px · z∈[-124,+6]mm · 10 of 32 slices shown, 13 images]
[im 3/32  brain]
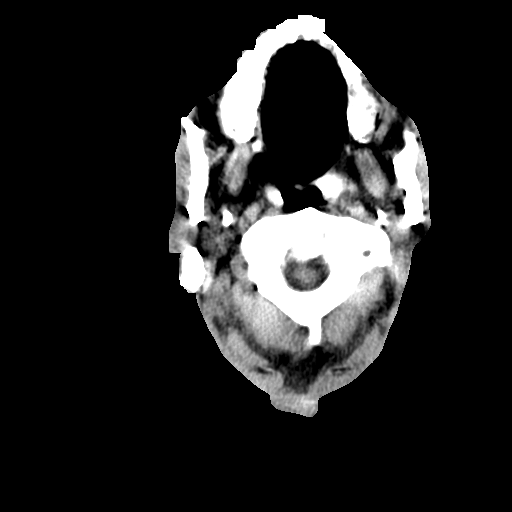
[im 3/32  bone]
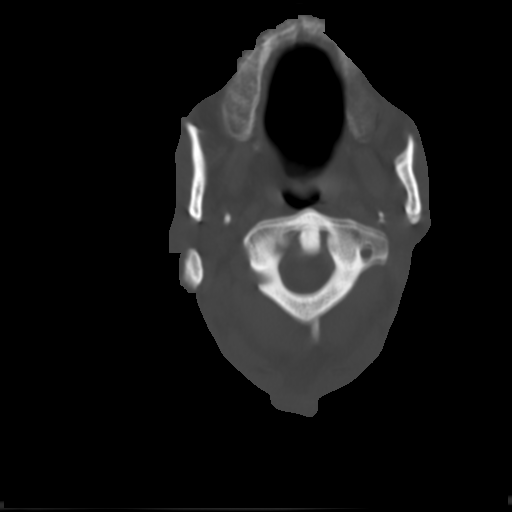
[im 6/32  brain]
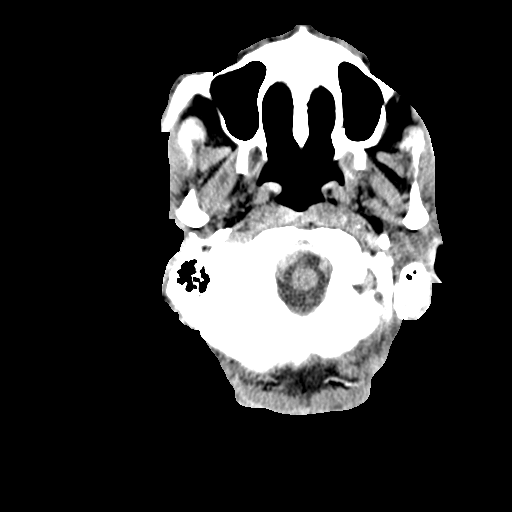
[im 9/32  brain]
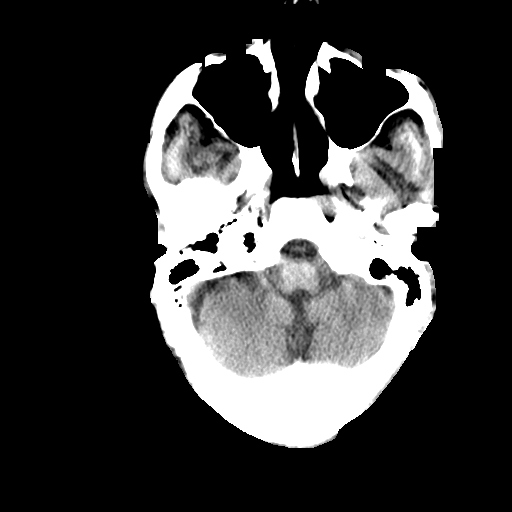
[im 11/32  brain]
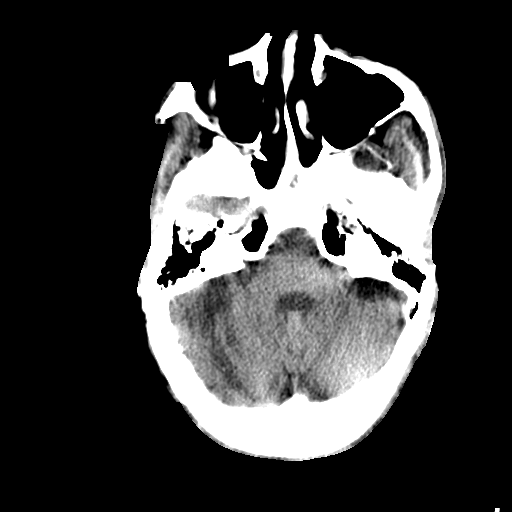
[im 14/32  brain]
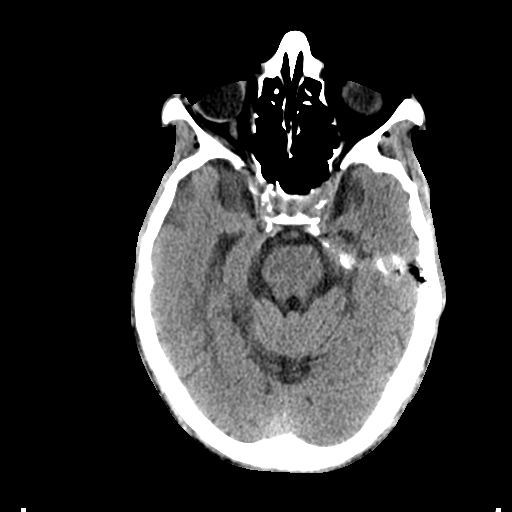
[im 14/32  bone]
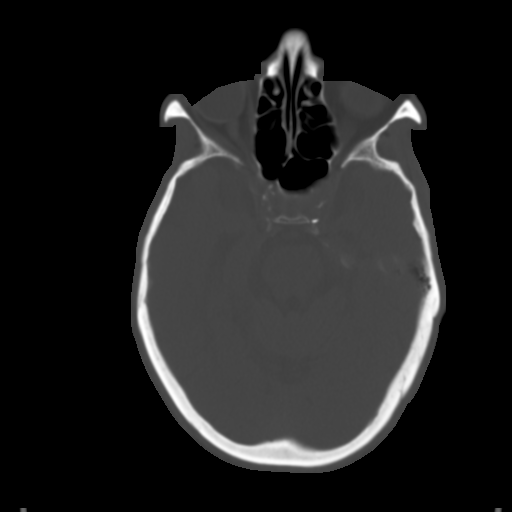
[im 18/32  brain]
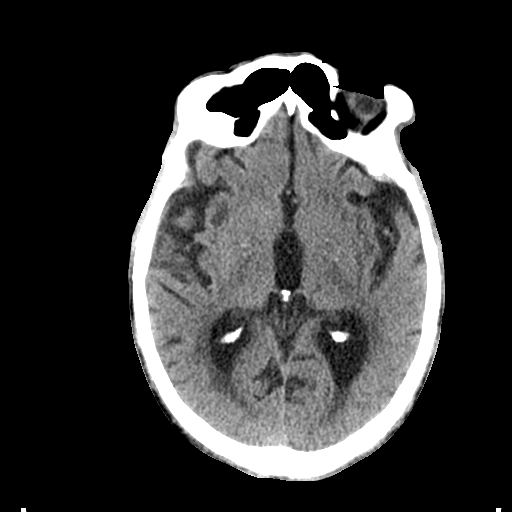
[im 21/32  brain]
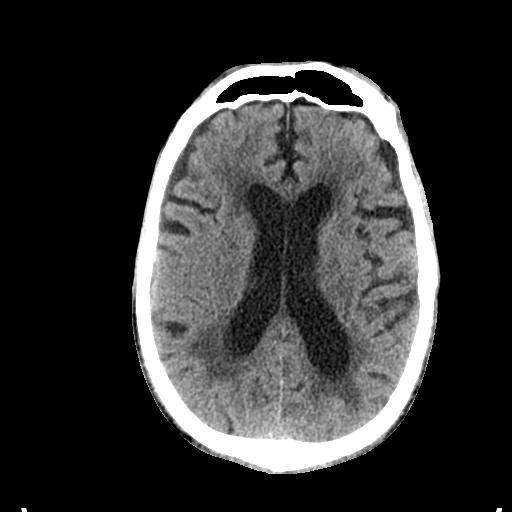
[im 24/32  brain]
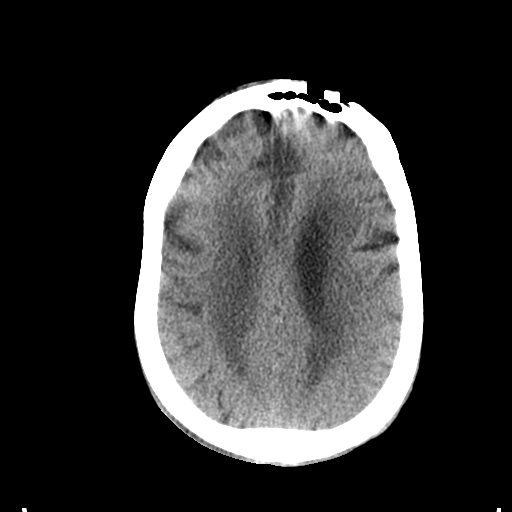
[im 26/32  brain]
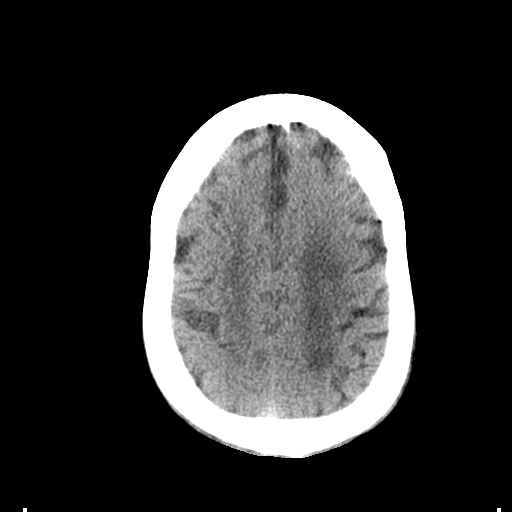
[im 26/32  bone]
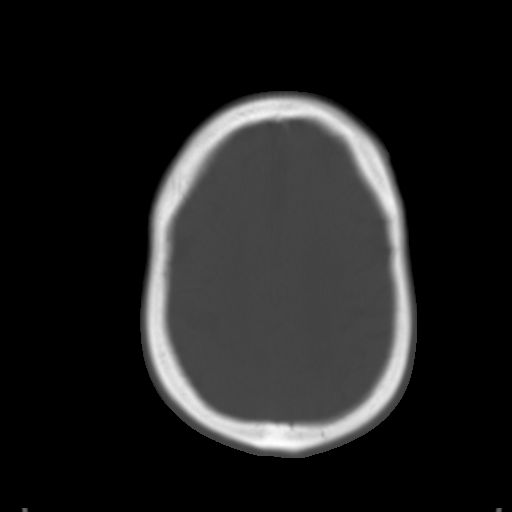
[im 29/32  brain]
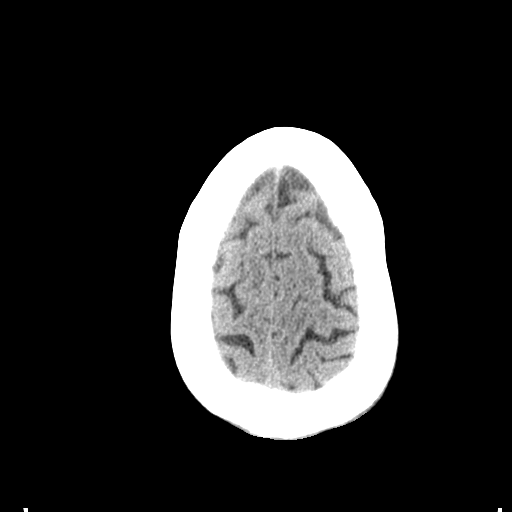

[Series 4: coronal soft tissue · coronal · 0.30mm/px · 3 of 69 slices shown]
[im 24/69  brain]
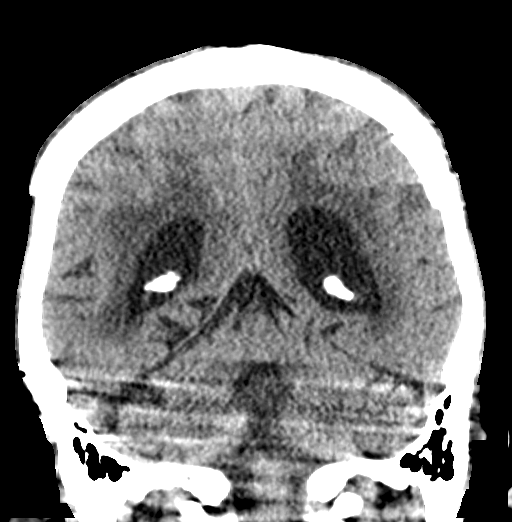
[im 31/69  brain]
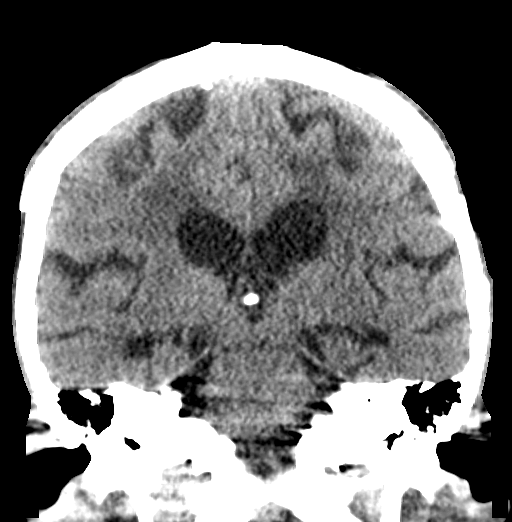
[im 38/69  brain]
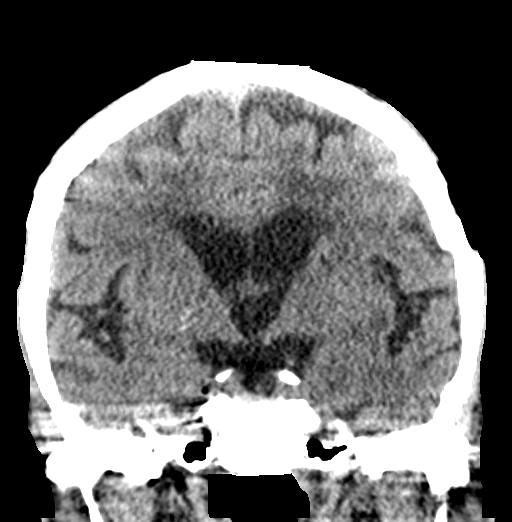

[Series 5: sagittal soft tissue · sagittal · 0.31mm/px · 3 of 52 slices shown]
[im 18/52  brain]
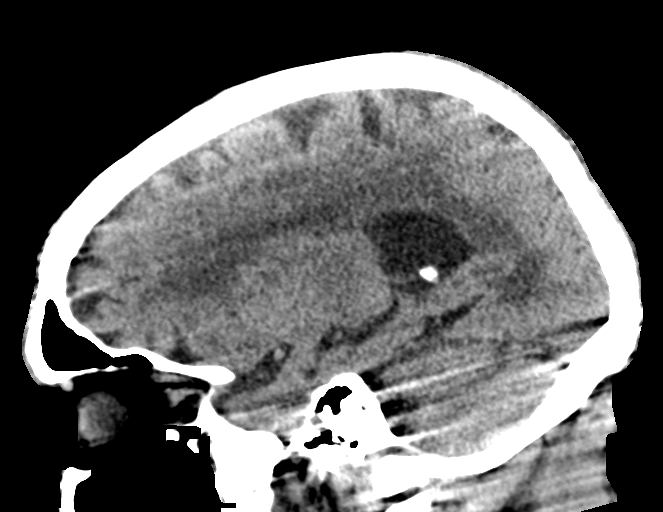
[im 26/52  brain]
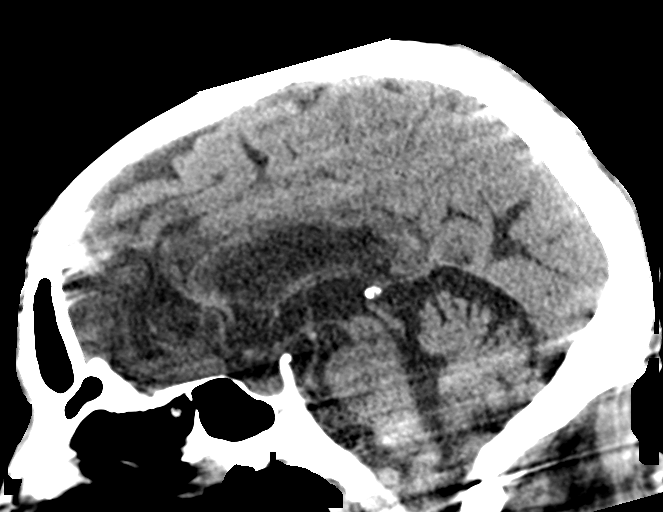
[im 35/52  brain]
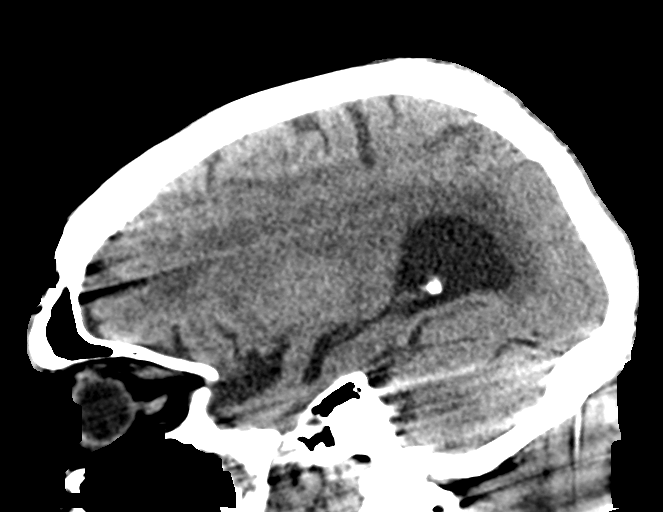

[16 of 47 positions shown; findings below may reference images not displayed]

FINDINGS: Brain: Mild diffuse cortical atrophy is noted. Mild chronic ischemic
white matter disease is noted. No mass effect or midline shift is
noted. Ventricular size is within normal limits. There is no
evidence of mass lesion, hemorrhage or acute infarction.

Vascular: No hyperdense vessel or unexpected calcification.

Skull: Normal. Negative for fracture or focal lesion.

Sinuses/Orbits: No acute finding.

Other: None.
IMPRESSION: Mild diffuse cortical atrophy. Mild chronic ischemic white matter
disease. No acute intracranial abnormality seen.
